# Patient Record
Sex: Male | Born: 1965 | Race: White | Hispanic: No | Marital: Married | Smoking: Never smoker
Health system: Southern US, Community
[De-identification: ages and names within clinical notes are randomized; demographics above are authoritative.]

## PROBLEM LIST (undated history)

## (undated) DIAGNOSIS — I10 Essential (primary) hypertension: Secondary | ICD-10-CM

## (undated) DIAGNOSIS — E785 Hyperlipidemia, unspecified: Secondary | ICD-10-CM

## (undated) DIAGNOSIS — G4730 Sleep apnea, unspecified: Secondary | ICD-10-CM

## (undated) HISTORY — DX: Essential (primary) hypertension: I10

## (undated) HISTORY — DX: Morbid (severe) obesity due to excess calories: E66.01

## (undated) HISTORY — DX: Sleep apnea, unspecified: G47.30

## (undated) HISTORY — DX: Hyperlipidemia, unspecified: E78.5

---

## 2014-06-30 ENCOUNTER — Emergency Department: Payer: Self-pay | Admitting: Emergency Medicine

## 2014-11-10 ENCOUNTER — Telehealth: Payer: Self-pay | Admitting: Family Medicine

## 2014-11-10 MED ORDER — ZOLPIDEM TARTRATE 10 MG PO TABS: 10.0000 mg | ORAL_TABLET | Freq: Once | ORAL | Status: DC

## 2014-11-10 NOTE — Telephone Encounter (Signed)
Patient uses [employer] in [place]

## 2014-11-10 NOTE — Telephone Encounter (Signed)
Rx called in to [facility]

## 2014-11-10 NOTE — Telephone Encounter (Signed)
Pt came in and said that he has his sleep study scheduled for Monday June 27 and they requested a sleep aid before he goes. He would like a call back at [phone]

## 2014-11-18 ENCOUNTER — Telehealth: Payer: Self-pay | Admitting: Family Medicine

## 2014-11-18 NOTE — Telephone Encounter (Signed)
Call pt resleep

## 2014-11-30 ENCOUNTER — Telehealth: Payer: Self-pay

## 2014-11-30 NOTE — Telephone Encounter (Signed)
Sleep study

## 2015-04-06 DIAGNOSIS — E785 Hyperlipidemia, unspecified: Secondary | ICD-10-CM | POA: Insufficient documentation

## 2015-04-06 DIAGNOSIS — I10 Essential (primary) hypertension: Secondary | ICD-10-CM | POA: Insufficient documentation

## 2015-04-07 ENCOUNTER — Ambulatory Visit (INDEPENDENT_AMBULATORY_CARE_PROVIDER_SITE_OTHER): Payer: BLUE CROSS/BLUE SHIELD | Admitting: Family Medicine

## 2015-04-07 ENCOUNTER — Encounter: Payer: Self-pay | Admitting: Family Medicine

## 2015-04-07 VITALS — BP 130/81 | HR 67 | Temp 97.9°F | Ht 71.6 in | Wt 297.0 lb

## 2015-04-07 DIAGNOSIS — G4730 Sleep apnea, unspecified: Secondary | ICD-10-CM | POA: Insufficient documentation

## 2015-04-07 DIAGNOSIS — E785 Hyperlipidemia, unspecified: Secondary | ICD-10-CM | POA: Diagnosis not present

## 2015-04-07 DIAGNOSIS — Z0000 Encounter for general adult medical examination without abnormal findings: Secondary | ICD-10-CM

## 2015-04-07 DIAGNOSIS — I10 Essential (primary) hypertension: Secondary | ICD-10-CM | POA: Diagnosis not present

## 2015-04-07 DIAGNOSIS — Z113 Encounter for screening for infections with a predominantly sexual mode of transmission: Secondary | ICD-10-CM

## 2015-04-07 LAB — URINALYSIS, ROUTINE W REFLEX MICROSCOPIC
BILIRUBIN UA: NEGATIVE
GLUCOSE, UA: NEGATIVE
Ketones, UA: NEGATIVE
Leukocytes, UA: NEGATIVE
Nitrite, UA: NEGATIVE
PROTEIN UA: NEGATIVE
RBC UA: NEGATIVE
SPEC GRAV UA: 1.015 (ref 1.005–1.030)
Urobilinogen, Ur: 0.2 mg/dL (ref 0.2–1.0)
pH, UA: 7 (ref 5.0–7.5)

## 2015-04-07 MED ORDER — AMLODIPINE BESYLATE 10 MG PO TABS: 10.0000 mg | ORAL_TABLET | Freq: Every day | ORAL | Status: DC

## 2015-04-07 MED ORDER — LOSARTAN POTASSIUM 100 MG PO TABS: 100.0000 mg | ORAL_TABLET | Freq: Every day | ORAL | Status: DC

## 2015-04-07 MED ORDER — ATORVASTATIN CALCIUM 40 MG PO TABS: 40.0000 mg | ORAL_TABLET | Freq: Every day | ORAL | Status: DC

## 2015-04-07 MED ORDER — RANITIDINE HCL 150 MG PO TABS: 150.0000 mg | ORAL_TABLET | Freq: Two times a day (BID) | ORAL | Status: DC

## 2015-04-07 NOTE — Assessment & Plan Note (Signed)
Discuussed diet

## 2015-04-07 NOTE — Assessment & Plan Note (Signed)
Using CPAP faithfully 's made a big difference no further daytime drowsiness

## 2015-04-07 NOTE — Assessment & Plan Note (Signed)
The current medical regimen is effective;  continue present plan and medications.  

## 2015-04-07 NOTE — Progress Notes (Signed)
BP 130/81 mmHg  Pulse 67  Temp(Src) 97.9 F (36.6 C)  Ht 5' 11.6" (1.819 m)  Wt 297 lb (134.718 kg)  BMI 40.72 kg/m2  SpO2 98%   Subjective:    Patient ID: Christopher PastorMark Edward Barclift, male    DOB: [birth year], 49 y.o.   MRN: [id number]  HPI: Christopher Hull is a 49 y.o. male  Chief Complaint  Patient presents with  . Annual Exam   patein's been using his CPAP faithfully made a world of difference to quote the patient.  blood pressures been doing  Well no complaints from medications  Cholesterol doing well no complaints  reflux doing well on Zantac no issues  Relevant past medical, surgical, family and social history reviewed and updated as indicated. Interim medical history since our last visit reviewed. Allergies and medications reviewed and updated.  Review of Systems  Constitutional: Negative.   HENT: Negative.   Eyes: Negative.   Respiratory: Negative.   Cardiovascular: Negative.   Gastrointestinal: Negative.   Endocrine: Negative.   Genitourinary: Negative.   Musculoskeletal: Negative.   Skin: Negative.   Allergic/Immunologic: Negative.   Neurological: Negative.   Hematological: Negative.   Psychiatric/Behavioral: Negative.     Per HPI unless specifically indicated above     Objective:    BP 130/81 mmHg  Pulse 67  Temp(Src) 97.9 F (36.6 C)  Ht 5' 11.6" (1.819 m)  Wt 297 lb (134.718 kg)  BMI 40.72 kg/m2  SpO2 98%  Wt Readings from Last 3 Encounters:  04/07/15 297 lb (134.718 kg)  09/22/14 304 lb (137.893 kg)    Physical Exam  Constitutional: He is oriented to person, place, and time. He appears well-developed and well-nourished.  HENT:  Head: Normocephalic and atraumatic.  Right Ear: External ear normal.  Left Ear: External ear normal.  Eyes: Conjunctivae and EOM are normal. Pupils are equal, round, and reactive to light.  Neck: Normal range of motion. Neck supple.  Cardiovascular: Normal rate, regular rhythm, normal heart sounds and intact distal  pulses.   Pulmonary/Chest: Effort normal and breath sounds normal.  Abdominal: Soft. Bowel sounds are normal. There is no splenomegaly or hepatomegaly.  Genitourinary: Rectum normal, prostate normal and penis normal.  Musculoskeletal: Normal range of motion.  Neurological: He is alert and oriented to person, place, and time. He has normal reflexes.  Skin: No rash noted. No erythema.  Psychiatric: He has a normal mood and affect. His behavior is normal. Judgment and thought content normal.    No results found for this or any previous visit.    Assessment & Plan:   Problem List Items Addressed This Visit      Cardiovascular and Mediastinum   Essential hypertension    The current medical regimen is effective;  continue present plan and medications.       Relevant Medications   amLODipine (NORVASC) 10 MG tablet   atorvastatin (LIPITOR) 40 MG tablet   losartan (COZAAR) 100 MG tablet     Other   Sleep apnea     Using CPAP faithfully 's made a big difference no further daytime drowsiness      Morbid obesity (HCC)     Discuussed diet      Hyperlipidemia    The current medical regimen is effective;  continue present plan and medications.       Relevant Medications   amLODipine (NORVASC) 10 MG tablet   atorvastatin (LIPITOR) 40 MG tablet   losartan (COZAAR) 100 MG tablet  Other Visit Diagnoses    Routine general medical examination at a health care facility    -  Primary    Relevant Orders    CBC with Differential/Platelet    Comprehensive metabolic panel    Lipid Panel w/o Chol/HDL Ratio    PSA    TSH    Urinalysis, Routine w reflex microscopic (not at [facility])    Routine screening for STI (sexually transmitted infection)        Relevant Orders    HIV antibody        Follow up plan: Return in about 6 months (around 10/05/2015) for BMP and med check , lipids, alt, ast.

## 2015-04-07 NOTE — Addendum Note (Signed)
Addended byVonita Moss: Billy Rocco, [place] on: 04/07/2015 05:15 PM   Modules accepted: K[employer] BroodSmartSet

## 2015-04-08 LAB — COMPREHENSIVE METABOLIC PANEL
A/G RATIO: 1.5 (ref 1.1–2.5)
ALK PHOS: 106 IU/L (ref 39–117)
ALT: 27 IU/L (ref 0–44)
AST: 18 IU/L (ref 0–40)
Albumin: 4.1 g/dL (ref 3.5–5.5)
BUN / CREAT RATIO: 17 (ref 9–20)
BUN: 12 mg/dL (ref 6–24)
Bilirubin Total: 0.6 mg/dL (ref 0.0–1.2)
CO2: 24 mmol/L (ref 18–29)
Calcium: 9.4 mg/dL (ref 8.7–10.2)
Chloride: 101 mmol/L (ref 97–106)
Creatinine, Ser: 0.69 mg/dL — ABNORMAL LOW (ref 0.76–1.27)
GFR calc non Af Amer: 111 mL/min/{1.73_m2} (ref >59)
GFR, EST AFRICAN AMERICAN: 129 mL/min/{1.73_m2} (ref >59)
Globulin, Total: 2.8 g/dL (ref 1.5–4.5)
Glucose: 102 mg/dL — ABNORMAL HIGH (ref 65–99)
POTASSIUM: 4.3 mmol/L (ref 3.5–5.2)
Sodium: 140 mmol/L (ref 136–144)
TOTAL PROTEIN: 6.9 g/dL (ref 6.0–8.5)

## 2015-04-08 LAB — CBC WITH DIFFERENTIAL/PLATELET
BASOS ABS: 0 10*3/uL (ref 0.0–0.2)
BASOS: 0 %
EOS (ABSOLUTE): 0.1 10*3/uL (ref 0.0–0.4)
Eos: 1 %
Hematocrit: 42.3 % (ref 37.5–51.0)
Hemoglobin: 14.8 g/dL (ref 12.6–17.7)
IMMATURE GRANS (ABS): 0 10*3/uL (ref 0.0–0.1)
Immature Granulocytes: 0 %
LYMPHS ABS: 1.9 10*3/uL (ref 0.7–3.1)
Lymphs: 31 %
MCH: 31.2 pg (ref 26.6–33.0)
MCHC: 35 g/dL (ref 31.5–35.7)
MCV: 89 fL (ref 79–97)
MONOS ABS: 0.6 10*3/uL (ref 0.1–0.9)
Monocytes: 9 %
NEUTROS ABS: 3.6 10*3/uL (ref 1.4–7.0)
Neutrophils: 59 %
Platelets: 324 10*3/uL (ref 150–379)
RBC: 4.75 x10E6/uL (ref 4.14–5.80)
RDW: 12.3 % (ref 12.3–15.4)
WBC: 6.3 10*3/uL (ref 3.4–10.8)

## 2015-04-08 LAB — LIPID PANEL W/O CHOL/HDL RATIO
CHOLESTEROL TOTAL: 124 mg/dL (ref 100–199)
HDL: 38 mg/dL — ABNORMAL LOW (ref >39)
LDL Calculated: 75 mg/dL (ref 0–99)
Triglycerides: 55 mg/dL (ref 0–149)
VLDL Cholesterol Cal: 11 mg/dL (ref 5–40)

## 2015-04-08 LAB — TSH: TSH: 1.99 u[IU]/mL (ref 0.450–4.500)

## 2015-04-08 LAB — PSA: Prostate Specific Ag, Serum: 1 ng/mL (ref 0.0–4.0)

## 2015-04-08 LAB — HIV ANTIBODY (ROUTINE TESTING W REFLEX): HIV Screen 4th Generation wRfx: NONREACTIVE

## 2015-04-11 ENCOUNTER — Encounter: Payer: Self-pay | Admitting: Family Medicine

## 2015-09-30 ENCOUNTER — Encounter: Payer: Self-pay | Admitting: Internal Medicine

## 2015-10-06 ENCOUNTER — Ambulatory Visit: Payer: BLUE CROSS/BLUE SHIELD | Admitting: Family Medicine

## 2017-04-07 ENCOUNTER — Encounter: Payer: Self-pay | Admitting: Family Medicine

## 2017-04-16 ENCOUNTER — Ambulatory Visit: Payer: Self-pay | Admitting: Family Medicine

## 2017-04-16 ENCOUNTER — Encounter: Payer: Self-pay | Admitting: Family Medicine

## 2017-04-16 VITALS — BP 138/80 | HR 80 | Ht 73.0 in | Wt 301.0 lb

## 2017-04-16 DIAGNOSIS — Z0289 Encounter for other administrative examinations: Secondary | ICD-10-CM

## 2017-04-16 DIAGNOSIS — G4730 Sleep apnea, unspecified: Secondary | ICD-10-CM

## 2017-04-16 DIAGNOSIS — I10 Essential (primary) hypertension: Secondary | ICD-10-CM

## 2017-04-16 DIAGNOSIS — Z029 Encounter for administrative examinations, unspecified: Secondary | ICD-10-CM

## 2017-04-16 NOTE — Assessment & Plan Note (Signed)
The current medical regimen is effective;  continue present plan and medications.  

## 2017-04-16 NOTE — Progress Notes (Signed)
BP 138/80   Pulse 80   Ht 6\' 1"  (1.854 m)   Wt (!) 301 lb (136.5 kg)   BMI 39.71 kg/m    Subjective:    Patient ID: Christopher Hull, male    DOB: 1966/02/12, 51 y.o.   MRN: [id number]  HPI: Christopher Hull is a 51 y.o. male  Chief Complaint  Patient presents with  . Flight    Class 3    Patientall in all doing well has sleep apnea report showing 99% usage for a half hours with no daytime drowsiness.  Blood pressure well controlled with amlodipine and losartan. Patient was given some pramipexole by sleep specialist to help with maybe some restless leg symptoms. Patient hasn't noticed any difference.Reviewed this medication with patient and he will not take this medicine. Relevant past medical, surgical, family and social history reviewed and updated as indicated. Interim medical history since our last visit reviewed. Allergies and medications reviewed and updated.  Review of Systems  Constitutional: Negative.   HENT: Negative.   Eyes: Negative.   Respiratory: Negative.   Cardiovascular: Negative.   Gastrointestinal: Negative.   Endocrine: Negative.   Genitourinary: Negative.   Musculoskeletal: Negative.   Skin: Negative.   Allergic/Immunologic: Negative.   Neurological: Negative.   Hematological: Negative.   Psychiatric/Behavioral: Negative.     Per HPI unless specifically indicated above     Objective:    BP 138/80   Pulse 80   Ht 6\' 1"  (1.854 m)   Wt (!) 301 lb (136.5 kg)   BMI 39.71 kg/m   Wt Readings from Last 3 Encounters:  04/16/17 (!) 301 lb (136.5 kg)  04/07/15 297 lb (134.7 kg)  09/22/14 (!) 304 lb (137.9 kg)    Physical Exam  Constitutional: He is oriented to person, place, and time. He appears well-developed and well-nourished.  HENT:  Head: Normocephalic.  Right Ear: External ear normal.  Left Ear: External ear normal.  Nose: Nose normal.  Eyes: Conjunctivae and EOM are normal. Pupils are equal, round, and reactive to light.  Neck:  Normal range of motion. Neck supple. No thyromegaly present.  Cardiovascular: Normal rate, regular rhythm, normal heart sounds and intact distal pulses.  Pulmonary/Chest: Effort normal and breath sounds normal.  Abdominal: Soft. Bowel sounds are normal. There is no splenomegaly or hepatomegaly.  Genitourinary: Penis normal.  Musculoskeletal: Normal range of motion.  Lymphadenopathy:    He has no cervical adenopathy.  Neurological: He is alert and oriented to person, place, and time. He has normal reflexes.  Skin: Skin is warm and dry.  Psychiatric: He has a normal mood and affect. His behavior is normal. Judgment and thought content normal.    Results for orders placed or performed in visit on 04/07/15  CBC with Differential/Platelet  Result Value Ref Range   WBC 6.3 3.4 - 10.8 x10E3/uL   RBC 4.75 4.14 - 5.80 x10E6/uL   Hemoglobin 14.8 12.6 - 17.7 g/dL   Hematocrit 46.942.3 62.937.5 - 51.0 %   MCV 89 79 - 97 fL   MCH 31.2 26.6 - 33.0 pg   MCHC 35.0 31.5 - 35.7 g/dL   RDW 52.812.3 41.312.3 - 24.415.4 %   Platelets 324 150 - 379 x10E3/uL   Neutrophils 59 %   Lymphs 31 %   Monocytes 9 %   Eos 1 %   Basos 0 %   Neutrophils Absolute 3.6 1.4 - 7.0 x10E3/uL   Lymphocytes Absolute 1.9 0.7 - 3.1 x10E3/uL  Monocytes Absolute 0.6 0.1 - 0.9 x10E3/uL   EOS (ABSOLUTE) 0.1 0.0 - 0.4 x10E3/uL   Basophils Absolute 0.0 0.0 - 0.2 x10E3/uL   Immature Granulocytes 0 %   Immature Grans (Abs) 0.0 0.0 - 0.1 x10E3/uL  Comprehensive metabolic panel  Result Value Ref Range   Glucose 102 (H) 65 - 99 mg/dL   BUN 12 6 - 24 mg/dL   Creatinine, Ser 1.610.69 (L) 0.76 - 1.27 mg/dL   GFR calc non Af Amer 111 >59 mL/min/1.73   GFR calc Af Amer 129 >59 mL/min/1.73   BUN/Creatinine Ratio 17 9 - 20   Sodium 140 136 - 144 mmol/L   Potassium 4.3 3.5 - 5.2 mmol/L   Chloride 101 97 - 106 mmol/L   CO2 24 18 - 29 mmol/L   Calcium 9.4 8.7 - 10.2 mg/dL   Total Protein 6.9 6.0 - 8.5 g/dL   Albumin 4.1 3.5 - 5.5 g/dL   Globulin, Total  2.8 1.5 - 4.5 g/dL   Albumin/Globulin Ratio 1.5 1.1 - 2.5   Bilirubin Total 0.6 0.0 - 1.2 mg/dL   Alkaline Phosphatase 106 39 - 117 IU/L   AST 18 0 - 40 IU/L   ALT 27 0 - 44 IU/L  Lipid Panel w/o Chol/HDL Ratio  Result Value Ref Range   Cholesterol, Total 124 100 - 199 mg/dL   Triglycerides 55 0 - 149 mg/dL   HDL 38 (L) >09>39 mg/dL   VLDL Cholesterol Cal 11 5 - 40 mg/dL   LDL Calculated 75 0 - 99 mg/dL  PSA  Result Value Ref Range   Prostate Specific Ag, Serum 1.0 0.0 - 4.0 ng/mL  TSH  Result Value Ref Range   TSH 1.990 0.450 - 4.500 uIU/mL  Urinalysis, Routine w reflex microscopic (not at [facility])  Result Value Ref Range   Specific Gravity, UA 1.015 1.005 - 1.030   pH, UA 7.0 5.0 - 7.5   Color, UA Yellow Yellow   Appearance Ur Clear Clear   Leukocytes, UA Negative Negative   Protein, UA Negative Negative/Trace   Glucose, UA Negative Negative   Ketones, UA Negative Negative   RBC, UA Negative Negative   Bilirubin, UA Negative Negative   Urobilinogen, Ur 0.2 0.2 - 1.0 mg/dL   Nitrite, UA Negative Negative  HIV antibody  Result Value Ref Range   HIV Screen 4th Generation wRfx Non Reactive Non Reactive      Assessment & Plan:   Problem List Items Addressed This Visit      Cardiovascular and Mediastinum   Essential hypertension    The current medical regimen is effective;  continue present plan and medications.         Respiratory   Sleep apnea    The current medical regimen is effective;  continue present plan and medications.        Other Visit Diagnoses    Encounter for Investment banker, [employer]) examination    -  Primary       Follow up plan: Return if symptoms worsen or fail to improve, for As scheduled.

## 2017-04-16 NOTE — Addendum Note (Signed)
Addended by: Vonita MossRISSMAN, Prestyn A on: 04/16/2017 02:51 PM   Modules accepted: Orders

## 2017-04-17 ENCOUNTER — Encounter: Payer: Self-pay | Admitting: Family Medicine

## 2017-04-17 LAB — BASIC METABOLIC PANEL
BUN/Creatinine Ratio: 19 (ref 9–20)
BUN: 15 mg/dL (ref 6–24)
CO2: 27 mmol/L (ref 20–29)
Calcium: 9.4 mg/dL (ref 8.7–10.2)
Chloride: 98 mmol/L (ref 96–106)
Creatinine, Ser: 0.78 mg/dL (ref 0.76–1.27)
GFR calc Af Amer: 120 mL/min/{1.73_m2} (ref >59)
GFR, EST NON AFRICAN AMERICAN: 104 mL/min/{1.73_m2} (ref >59)
GLUCOSE: 89 mg/dL (ref 65–99)
Potassium: 4.2 mmol/L (ref 3.5–5.2)
SODIUM: 141 mmol/L (ref 134–144)

## 2017-06-24 ENCOUNTER — Other Ambulatory Visit: Payer: Self-pay

## 2017-06-24 MED ORDER — AMLODIPINE BESYLATE 10 MG PO TABS: 10.0000 mg | ORAL_TABLET | Freq: Every day | ORAL | 6 refills | Status: DC

## 2017-06-24 MED ORDER — ATORVASTATIN CALCIUM 40 MG PO TABS: 40.0000 mg | ORAL_TABLET | Freq: Every day | ORAL | 6 refills | Status: DC

## 2017-06-24 MED ORDER — LOSARTAN POTASSIUM 100 MG PO TABS: 100.0000 mg | ORAL_TABLET | Freq: Every day | ORAL | 6 refills | Status: DC

## 2017-06-24 MED ORDER — RANITIDINE HCL 150 MG PO TABS: 150.0000 mg | ORAL_TABLET | Freq: Two times a day (BID) | ORAL | 6 refills | Status: DC

## 2017-06-24 NOTE — Telephone Encounter (Signed)
Apt May or so BP and med check

## 2017-07-11 ENCOUNTER — Encounter: Payer: Self-pay | Admitting: Family Medicine

## 2017-07-11 NOTE — Telephone Encounter (Signed)
Letter mailed

## 2017-12-31 ENCOUNTER — Other Ambulatory Visit: Payer: Self-pay

## 2018-01-01 MED ORDER — AMLODIPINE BESYLATE 10 MG PO TABS: 10.0000 mg | ORAL_TABLET | Freq: Every day | ORAL | 0 refills | Status: DC

## 2018-01-01 MED ORDER — ATORVASTATIN CALCIUM 40 MG PO TABS: 40.0000 mg | ORAL_TABLET | Freq: Every day | ORAL | 0 refills | Status: DC

## 2018-01-01 MED ORDER — LOSARTAN POTASSIUM 100 MG PO TABS: 100.0000 mg | ORAL_TABLET | Freq: Every day | ORAL | 0 refills | Status: DC

## 2018-01-01 MED ORDER — RANITIDINE HCL 150 MG PO TABS: 150.0000 mg | ORAL_TABLET | Freq: Two times a day (BID) | ORAL | 0 refills | Status: DC

## 2018-01-01 NOTE — Telephone Encounter (Signed)
#  15 sent -- needs appt for further refills

## 2018-02-21 ENCOUNTER — Other Ambulatory Visit: Payer: Self-pay | Admitting: Family Medicine

## 2018-02-21 NOTE — Telephone Encounter (Signed)
Requested medication (s) are due for refill today: yes  Requested medication (s) are on the active medication list: yes  Last refill:  02/05/18  Future visit scheduled: yes  Notes to clinic: last office visit 04/07/15    Requested Prescriptions  Pending Prescriptions Disp Refills   ranitidine (ZANTAC) 150 MG tablet [Pharmacy Med Name: RANITIDINE 150 MG TABLET] 30 tablet 0    Sig: TAKE 1 TABLET BY MOUTH TWICE DAILY     Gastroenterology:  H2 Antagonists Passed - 02/21/2018 11:51 AM      Passed - Valid encounter within last 12 months    Recent Outpatient Visits          10 months ago Audiological scientist for [employer] examination   [facility], Redge Gainer, MD   2 years ago Routine general medical examination at a health care facility   [facility], Redge Gainer, MD            losartan (COZAAR) 100 MG tablet [Pharmacy Med Name: LOSARTAN POTASSIUM 100 MG TAB] 15 tablet 0    Sig: TAKE (1) TABLET BY MOUTH DAILY FOR HIGH BLOOD PRESSURE.     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   [facility] [facility], Redge Gainer, MD   2 years ago Routine general medical examination at a health care facility   [facility], Redge Gainer, MD             Passed - Patient is not pregnant      Passed - Last BP in normal range    BP Readings from Last 1 Encounters:  04/16/17 138/80        atorvastatin (LIPITOR) 40 MG tablet [Pharmacy  Med Name: ATORVASTATIN 40 MG TABLET] 15 tablet 0    Sig: TAKE 1 TABLET BY MOUTH AT BEDTIME FOR CHOLESTEROL     Cardiovascular:  Antilipid - Statins Failed - 02/21/2018 11:51 AM      Failed - Total Cholesterol in normal range and within 360 days    Cholesterol, Total  Date Value Ref Range Status  04/07/2015 124 100 - 199 mg/dL Final         Failed - LDL in normal range and within 360 days    LDL Calculated  Date Value Ref Range Status  04/07/2015 75 0 - 99 mg/dL Final         Failed - HDL in normal range and within 360 days    HDL  Date Value Ref Range Status  04/07/2015 38 (L) >39 mg/dL Final         Failed - Triglycerides in normal range and within 360 days    Triglycerides  Date Value Ref Range Status  04/07/2015 55 0 - 149 mg/dL Final  Passed - Patient is not pregnant      Passed - Valid encounter within last 12 months    Recent Outpatient Visits          10 months ago Audiological scientist for [employer] examination   [facility], Redge Gainer, MD   2 years ago Routine general medical examination at a health care facility   [facility], Redge Gainer, MD            amLODipine (NORVASC) 10 MG tablet [Pharmacy Med Name: AMLODIPINE BESYLATE 10 MG TAB] 15 tablet 0    Sig: TAKE (1) TABLET BY MOUTH DAILY FOR HIGH BLOOD PRESSURE.     Cardiovascular:  Calcium Channel Blockers Failed - 02/21/2018 11:51 AM      Failed - Valid encounter within last 6 months    Recent Outpatient Visits          10 months ago Encounter for [employer] examination   [facility], Redge Gainer, MD   2 years ago Routine general medical examination at a health care facility   [facility], Redge Gainer, MD             Passed - Last BP in normal range    BP Readings from Last 1 Encounters:  04/16/17 138/80

## 2018-02-24 ENCOUNTER — Telehealth: Payer: Self-pay | Admitting: Family Medicine

## 2018-02-24 MED ORDER — ATORVASTATIN CALCIUM 40 MG PO TABS: 40.0000 mg | ORAL_TABLET | Freq: Every day | ORAL | 0 refills | Status: DC

## 2018-02-24 MED ORDER — LOSARTAN POTASSIUM 100 MG PO TABS: 100.0000 mg | ORAL_TABLET | Freq: Every day | ORAL | 0 refills | Status: DC

## 2018-02-24 MED ORDER — RANITIDINE HCL 150 MG PO TABS: 150.0000 mg | ORAL_TABLET | Freq: Two times a day (BID) | ORAL | 0 refills | Status: DC

## 2018-02-24 MED ORDER — AMLODIPINE BESYLATE 10 MG PO TABS: 10.0000 mg | ORAL_TABLET | Freq: Every day | ORAL | 0 refills | Status: DC

## 2018-02-24 NOTE — Telephone Encounter (Signed)
Pt has an appointment on 10/152019 but has ran out of medicine. Wants to know if he can get a refill until the next appointment. Pt would like to have atorvastatin 40 MG, amlodipine 10 mg,rantidine 150 Mg, and losartan 100 mg. States he has reached out to pharmacy. Wants sent to [facility] in [place].

## 2018-02-24 NOTE — Telephone Encounter (Signed)
Already sent  Copied from CRM [id number]. Topic: General - Other >> Feb 24, 2018 10:40 AM Percival Spanish wrote: Pt is out of the med and has an appt on 03/04/18 and is asking if enough meds can be called in till his appt     amLODipine (NORVASC) 10 MG tablet   atorvastatin (LIPITOR) 40 MG tablet   losartan (COZAAR) 100 MG tablet   ranitidine (ZANTAC) 150 MG tablet   Pharmacy [facility]

## 2018-02-24 NOTE — Telephone Encounter (Signed)
RXs sent.

## 2018-03-04 ENCOUNTER — Ambulatory Visit (INDEPENDENT_AMBULATORY_CARE_PROVIDER_SITE_OTHER): Payer: Self-pay | Admitting: Family Medicine

## 2018-03-04 ENCOUNTER — Encounter: Payer: Self-pay | Admitting: Family Medicine

## 2018-03-04 DIAGNOSIS — G4730 Sleep apnea, unspecified: Secondary | ICD-10-CM

## 2018-03-04 DIAGNOSIS — I10 Essential (primary) hypertension: Secondary | ICD-10-CM

## 2018-03-04 DIAGNOSIS — E785 Hyperlipidemia, unspecified: Secondary | ICD-10-CM

## 2018-03-04 MED ORDER — RANITIDINE HCL 150 MG PO TABS: 150.0000 mg | ORAL_TABLET | Freq: Two times a day (BID) | ORAL | 1 refills | Status: DC

## 2018-03-04 MED ORDER — ATORVASTATIN CALCIUM 40 MG PO TABS: 40.0000 mg | ORAL_TABLET | Freq: Every day | ORAL | 1 refills | Status: DC

## 2018-03-04 MED ORDER — AMLODIPINE BESYLATE 10 MG PO TABS: 10.0000 mg | ORAL_TABLET | Freq: Every day | ORAL | 1 refills | Status: DC

## 2018-03-04 MED ORDER — LOSARTAN POTASSIUM 100 MG PO TABS: 100.0000 mg | ORAL_TABLET | Freq: Every day | ORAL | 1 refills | Status: DC

## 2018-03-04 NOTE — Assessment & Plan Note (Signed)
Using CPAP faithfully with resolution of daytime drowsiness

## 2018-03-04 NOTE — Assessment & Plan Note (Signed)
The current medical regimen is effective;  continue present plan and medications.  

## 2018-03-04 NOTE — Assessment & Plan Note (Signed)
Discussed weight loss diet nutrition 

## 2018-03-04 NOTE — Progress Notes (Signed)
BP (!) 150/96 (BP Location: Left Arm)   Pulse 64   Temp (!) 97.1 F (36.2 C) (Oral)   Ht 6\' 1"  (1.854 m)   Wt (!) 313 lb 3.2 oz (142.1 kg)   SpO2 95%   BMI 41.32 kg/m    Subjective:    Patient ID: Christopher Hull, male    DOB: October 20, 1965, 52 y.o.   MRN: [id number]  HPI: Christopher Hull is a 52 y.o. male  Chief Complaint  Patient presents with  . Hyperlipidemia  . Hypertension   Patient all in all doing okay taking amlodipine losartan every day without problems.  Also taking Lipitor 40 mg a day without problems and wondered about taking Zantac on a regular basis. Reflux is stable on his current regimen of twice a day. Patient unfortunately has gained weight over this last summer.  Has noted elevated blood pressure readings especially with whitecoat. Relevant past medical, surgical, family and social history reviewed and updated as indicated. Interim medical history since our last visit reviewed. Allergies and medications reviewed and updated.  Review of Systems  Constitutional: Negative.   Respiratory: Negative.   Cardiovascular: Negative.     Per HPI unless specifically indicated above     Objective:    BP (!) 150/96 (BP Location: Left Arm)   Pulse 64   Temp (!) 97.1 F (36.2 C) (Oral)   Ht 6\' 1"  (1.854 m)   Wt (!) 313 lb 3.2 oz (142.1 kg)   SpO2 95%   BMI 41.32 kg/m   Wt Readings from Last 3 Encounters:  03/04/18 (!) 313 lb 3.2 oz (142.1 kg)  04/16/17 (!) 301 lb (136.5 kg)  04/07/15 297 lb (134.7 kg)    Physical Exam  Constitutional: He is oriented to person, place, and time. He appears well-developed and well-nourished.  HENT:  Head: Normocephalic and atraumatic.  Eyes: Conjunctivae and EOM are normal.  Neck: Normal range of motion.  Cardiovascular: Normal rate, regular rhythm and normal heart sounds.  Pulmonary/Chest: Effort normal and breath sounds normal.  Musculoskeletal: Normal range of motion.  Neurological: He is alert and oriented to person,  place, and time.  Skin: No erythema.  Psychiatric: He has a normal mood and affect. His behavior is normal. Judgment and thought content normal.    Results for orders placed or performed in visit on 04/16/17  Basic metabolic panel  Result Value Ref Range   Glucose 89 65 - 99 mg/dL   BUN 15 6 - 24 mg/dL   Creatinine, Ser 4.09 0.76 - 1.27 mg/dL   GFR calc non Af Amer 104 >59 mL/min/1.73   GFR calc Af Amer 120 >59 mL/min/1.73   BUN/Creatinine Ratio 19 9 - 20   Sodium 141 134 - 144 mmol/L   Potassium 4.2 3.5 - 5.2 mmol/L   Chloride 98 96 - 106 mmol/L   CO2 27 20 - 29 mmol/L   Calcium 9.4 8.7 - 10.2 mg/dL      Assessment & Plan:   Problem List Items Addressed This Visit      Cardiovascular and Mediastinum   Essential hypertension    Discussed elevated blood pressure with need for better control patient will control with lifestyle weight loss and better nutrition.  Will recheck later this winter.      Relevant Medications   amLODipine (NORVASC) 10 MG tablet   losartan (COZAAR) 100 MG tablet   atorvastatin (LIPITOR) 40 MG tablet     Respiratory   Sleep apnea  Using CPAP faithfully with resolution of daytime drowsiness        Other   Hyperlipidemia    The current medical regimen is effective;  continue present plan and medications.       Relevant Medications   amLODipine (NORVASC) 10 MG tablet   losartan (COZAAR) 100 MG tablet   atorvastatin (LIPITOR) 40 MG tablet   Morbid obesity (HCC)    Discussed weight loss diet nutrition          Follow up plan: Return in about 3 months (around 06/04/2018) for Physical Exam.

## 2018-03-04 NOTE — Assessment & Plan Note (Signed)
Discussed elevated blood pressure with need for better control patient will control with lifestyle weight loss and better nutrition.  Will recheck later this winter.

## 2018-03-13 ENCOUNTER — Ambulatory Visit: Payer: BLUE CROSS/BLUE SHIELD | Admitting: Family Medicine

## 2018-06-04 ENCOUNTER — Encounter: Payer: Self-pay | Admitting: Family Medicine

## 2018-06-04 ENCOUNTER — Ambulatory Visit (INDEPENDENT_AMBULATORY_CARE_PROVIDER_SITE_OTHER): Payer: Self-pay | Admitting: Family Medicine

## 2018-06-04 VITALS — BP 140/83 | HR 62 | Temp 97.7°F | Ht 73.0 in | Wt 311.6 lb

## 2018-06-04 DIAGNOSIS — Z0000 Encounter for general adult medical examination without abnormal findings: Secondary | ICD-10-CM

## 2018-06-04 DIAGNOSIS — I10 Essential (primary) hypertension: Secondary | ICD-10-CM

## 2018-06-04 DIAGNOSIS — E785 Hyperlipidemia, unspecified: Secondary | ICD-10-CM

## 2018-06-04 LAB — URINALYSIS, ROUTINE W REFLEX MICROSCOPIC
Bilirubin, UA: NEGATIVE
Glucose, UA: NEGATIVE
Ketones, UA: NEGATIVE
LEUKOCYTES UA: NEGATIVE
Nitrite, UA: NEGATIVE
Protein, UA: NEGATIVE
RBC, UA: NEGATIVE
Specific Gravity, UA: 1.02 (ref 1.005–1.030)
Urobilinogen, Ur: 0.2 mg/dL (ref 0.2–1.0)
pH, UA: 6 (ref 5.0–7.5)

## 2018-06-04 MED ORDER — AMLODIPINE BESYLATE 10 MG PO TABS: 10.0000 mg | ORAL_TABLET | Freq: Every day | ORAL | 4 refills | Status: DC

## 2018-06-04 MED ORDER — ATORVASTATIN CALCIUM 40 MG PO TABS: 40.0000 mg | ORAL_TABLET | Freq: Every day | ORAL | 4 refills | Status: DC

## 2018-06-04 MED ORDER — RANITIDINE HCL 150 MG PO TABS: 150.0000 mg | ORAL_TABLET | Freq: Two times a day (BID) | ORAL | 4 refills | Status: DC

## 2018-06-04 MED ORDER — LOSARTAN POTASSIUM 100 MG PO TABS: 100.0000 mg | ORAL_TABLET | Freq: Every day | ORAL | 4 refills | Status: DC

## 2018-06-04 NOTE — Assessment & Plan Note (Signed)
The current medical regimen is effective;  continue present plan and medications.  

## 2018-06-04 NOTE — Progress Notes (Signed)
[facility] 06/04/2018 03/04/2018  Decreased Interest 0 0  Down, Depressed, Hopeless 0 0  PHQ - 2 Score 0 0  Altered sleeping 0 -  Tired, decreased energy 0 -  Change in appetite 0 -  Feeling bad or failure about yourself  0 -  Trouble concentrating 0 -  Moving slowly or fidgety/restless 0 -  Suicidal thoughts 0 -  PHQ-9 Score 0 -   Functional Status Survey: Is the patient deaf or have difficulty hearing?: No Does the patient have difficulty seeing, even when wearing glasses/contacts?: No Does the patient have difficulty concentrating, remembering, or making decisions?: No Does the patient have difficulty walking or climbing stairs?: No Does the patient have difficulty dressing or bathing?: No Does the patient have difficulty doing errands alone such as visiting a doctor's office or shopping?: No

## 2018-06-04 NOTE — Progress Notes (Signed)
BP 140/83 (BP Location: Left Arm, Patient Position: Sitting, Cuff Size: Large)   Pulse 62   Temp 97.7 F (36.5 C) (Oral)   Ht 6\' 1"  (1.854 m)   Wt (!) 311 lb 9.6 oz (141.3 kg)   SpO2 97%   BMI 41.11 kg/m    Subjective:    Patient ID: Christopher Hull, male    DOB: 03/22/1966, 53 y.o.   MRN: [id number]  HPI: Demetries Arnau is a 53 y.o. male  Chief Complaint  Patient presents with  . Annual Exam  Patient all in all doing okay except for weight.  Patient struggles with weight especially over the Christmas holidays. Blood pressures been doing okay along with cholesterol medication.  Takes ranitidine almost twice a day most days.  Relevant past medical, surgical, family and social history reviewed and updated as indicated. Interim medical history since our last visit reviewed. Allergies and medications reviewed and updated.  Review of Systems  Constitutional: Negative.   HENT: Negative.   Eyes: Negative.   Respiratory: Negative.   Cardiovascular: Negative.   Gastrointestinal: Negative.   Endocrine: Negative.   Genitourinary: Negative.   Musculoskeletal: Negative.   Skin: Negative.   Allergic/Immunologic: Negative.   Neurological: Negative.   Hematological: Negative.   Psychiatric/Behavioral: Negative.     Per HPI unless specifically indicated above     Objective:    BP 140/83 (BP Location: Left Arm, Patient Position: Sitting, Cuff Size: Large)   Pulse 62   Temp 97.7 F (36.5 C) (Oral)   Ht 6\' 1"  (1.854 m)   Wt (!) 311 lb 9.6 oz (141.3 kg)   SpO2 97%   BMI 41.11 kg/m   Wt Readings from Last 3 Encounters:  06/04/18 (!) 311 lb 9.6 oz (141.3 kg)  03/04/18 (!) 313 lb 3.2 oz (142.1 kg)  04/16/17 (!) 301 lb (136.5 kg)    Physical Exam Constitutional:      Appearance: He is well-developed.  HENT:     Head: Normocephalic and atraumatic.     Right Ear: External ear normal.     Left Ear: External ear normal.  Eyes:     Conjunctiva/sclera: Conjunctivae normal.       Pupils: Pupils are equal, round, and reactive to light.  Neck:     Musculoskeletal: Normal range of motion and neck supple.  Cardiovascular:     Rate and Rhythm: Normal rate and regular rhythm.     Heart sounds: Normal heart sounds.  Pulmonary:     Effort: Pulmonary effort is normal.     Breath sounds: Normal breath sounds.  Abdominal:     General: Bowel sounds are normal.     Palpations: Abdomen is soft. There is no hepatomegaly or splenomegaly.  Genitourinary:    Penis: Normal.      Prostate: Normal.     Rectum: Normal.  Musculoskeletal: Normal range of motion.  Skin:    Findings: No erythema or rash.  Neurological:     Mental Status: He is alert and oriented to person, place, and time.     Deep Tendon Reflexes: Reflexes are normal and symmetric.  Psychiatric:        Behavior: Behavior normal.        Thought Content: Thought content normal.        Judgment: Judgment normal.     Results for orders placed or performed in visit on 04/16/17  Basic metabolic panel  Result Value Ref Range   Glucose 89 65 -  99 mg/dL   BUN 15 6 - 24 mg/dL   Creatinine, Ser 1.54 0.76 - 1.27 mg/dL   GFR calc non Af Amer 104 >59 mL/min/1.73   GFR calc Af Amer 120 >59 mL/min/1.73   BUN/Creatinine Ratio 19 9 - 20   Sodium 141 134 - 144 mmol/L   Potassium 4.2 3.5 - 5.2 mmol/L   Chloride 98 96 - 106 mmol/L   CO2 27 20 - 29 mmol/L   Calcium 9.4 8.7 - 10.2 mg/dL      Assessment & Plan:   Problem List Items Addressed This Visit      Cardiovascular and Mediastinum   Essential hypertension    The current medical regimen is effective;  continue present plan and medications.       Relevant Medications   amLODipine (NORVASC) 10 MG tablet   losartan (COZAAR) 100 MG tablet   atorvastatin (LIPITOR) 40 MG tablet     Other   Hyperlipidemia    The current medical regimen is effective;  continue present plan and medications.       Relevant Medications   amLODipine (NORVASC) 10 MG tablet    losartan (COZAAR) 100 MG tablet   atorvastatin (LIPITOR) 40 MG tablet   Morbid obesity (HCC)    Discussed diet wt loss       Other Visit Diagnoses    PE (physical exam), annual    -  Primary   Relevant Orders   Comprehensive metabolic panel   Lipid panel   CBC with Differential/Platelet   TSH   Urinalysis, Routine w reflex microscopic   PSA       Follow up plan: Return in about 6 months (around 12/03/2018) for BMP,  Lipids, ALT, AST.

## 2018-06-04 NOTE — Assessment & Plan Note (Signed)
Discussed diet wt loss

## 2018-06-05 LAB — LIPID PANEL
Chol/HDL Ratio: 3.5 ratio (ref 0.0–5.0)
Cholesterol, Total: 137 mg/dL (ref 100–199)
HDL: 39 mg/dL — ABNORMAL LOW (ref >39)
LDL Calculated: 83 mg/dL (ref 0–99)
Triglycerides: 76 mg/dL (ref 0–149)
VLDL Cholesterol Cal: 15 mg/dL (ref 5–40)

## 2018-06-05 LAB — CBC WITH DIFFERENTIAL/PLATELET
Basophils Absolute: 0.1 10*3/uL (ref 0.0–0.2)
Basos: 1 %
EOS (ABSOLUTE): 0.1 10*3/uL (ref 0.0–0.4)
Eos: 2 %
Hematocrit: 42.7 % (ref 37.5–51.0)
Hemoglobin: 14.8 g/dL (ref 13.0–17.7)
IMMATURE GRANULOCYTES: 1 %
Immature Grans (Abs): 0 10*3/uL (ref 0.0–0.1)
LYMPHS ABS: 2.4 10*3/uL (ref 0.7–3.1)
Lymphs: 37 %
MCH: 32 pg (ref 26.6–33.0)
MCHC: 34.7 g/dL (ref 31.5–35.7)
MCV: 92 fL (ref 79–97)
Monocytes Absolute: 0.5 10*3/uL (ref 0.1–0.9)
Monocytes: 7 %
Neutrophils Absolute: 3.5 10*3/uL (ref 1.4–7.0)
Neutrophils: 52 %
Platelets: 273 10*3/uL (ref 150–450)
RBC: 4.63 x10E6/uL (ref 4.14–5.80)
RDW: 12.5 % (ref 11.6–15.4)
WBC: 6.6 10*3/uL (ref 3.4–10.8)

## 2018-06-05 LAB — COMPREHENSIVE METABOLIC PANEL
A/G RATIO: 1.5 (ref 1.2–2.2)
ALT: 34 IU/L (ref 0–44)
AST: 19 IU/L (ref 0–40)
Albumin: 4.3 g/dL (ref 3.5–5.5)
Alkaline Phosphatase: 142 IU/L — ABNORMAL HIGH (ref 39–117)
BILIRUBIN TOTAL: 0.7 mg/dL (ref 0.0–1.2)
BUN/Creatinine Ratio: 21 — ABNORMAL HIGH (ref 9–20)
BUN: 15 mg/dL (ref 6–24)
CHLORIDE: 105 mmol/L (ref 96–106)
CO2: 21 mmol/L (ref 20–29)
Calcium: 9.3 mg/dL (ref 8.7–10.2)
Creatinine, Ser: 0.73 mg/dL — ABNORMAL LOW (ref 0.76–1.27)
GFR calc Af Amer: 122 mL/min/{1.73_m2} (ref >59)
GFR calc non Af Amer: 106 mL/min/{1.73_m2} (ref >59)
Globulin, Total: 2.9 g/dL (ref 1.5–4.5)
Glucose: 87 mg/dL (ref 65–99)
Potassium: 4.1 mmol/L (ref 3.5–5.2)
Sodium: 142 mmol/L (ref 134–144)
Total Protein: 7.2 g/dL (ref 6.0–8.5)

## 2018-06-05 LAB — PSA: Prostate Specific Ag, Serum: 1 ng/mL (ref 0.0–4.0)

## 2018-06-05 LAB — TSH: TSH: 2.58 u[IU]/mL (ref 0.450–4.500)

## 2018-06-09 ENCOUNTER — Telehealth: Payer: Self-pay | Admitting: Family Medicine

## 2018-06-09 DIAGNOSIS — R748 Abnormal levels of other serum enzymes: Secondary | ICD-10-CM

## 2018-06-09 NOTE — Telephone Encounter (Signed)
-----   Message from Richarda Overlie, [place] sent at 06/09/2018  1:52 PM EST ----- Patient was transferred to provider for telephone conversation.

## 2018-06-09 NOTE — Telephone Encounter (Signed)
Phone call Discussed with patient elevated alkaline phosphatase will wait a couple of months and recheck blood work with CMP and if still elevated will further evaluate.

## 2018-12-03 ENCOUNTER — Other Ambulatory Visit: Payer: Self-pay

## 2018-12-03 ENCOUNTER — Encounter: Payer: Self-pay | Admitting: Family Medicine

## 2018-12-03 ENCOUNTER — Ambulatory Visit (INDEPENDENT_AMBULATORY_CARE_PROVIDER_SITE_OTHER): Payer: Self-pay | Admitting: Family Medicine

## 2018-12-03 DIAGNOSIS — I10 Essential (primary) hypertension: Secondary | ICD-10-CM

## 2018-12-03 DIAGNOSIS — E785 Hyperlipidemia, unspecified: Secondary | ICD-10-CM

## 2018-12-03 MED ORDER — OMEPRAZOLE 20 MG PO CPDR: 20.0000 mg | DELAYED_RELEASE_CAPSULE | Freq: Every day | ORAL | 3 refills | Status: DC

## 2018-12-03 NOTE — Assessment & Plan Note (Signed)
Wt gain with covid and leg edema wears support hose and does ok Discussed wt loss

## 2018-12-03 NOTE — Assessment & Plan Note (Signed)
The current medical regimen is effective;  continue present plan and medications.  

## 2018-12-03 NOTE — Progress Notes (Signed)
There were no vitals taken for this visit.   Subjective:    Patient ID: Christopher Hull, male    DOB: [birth year], 53 y.o.   MRN: [id number]  HPI: Christopher Hull is a 53 y.o. male  Med check Discussed medication with patient doing well with blood pressure good control no issues stopped ranitidine and switch to over-the-counter Prilosec which is done better and is been able to eat a regular diet without problems. 1 of the issues though is weight gain with increasing edema of his legs with no PND orthopnea. Wears compression socks and does okay also does some elevation.  Relevant past medical, surgical, family and social history reviewed and updated as indicated. Interim medical history since our last visit reviewed. Allergies and medications reviewed and updated.  Review of Systems  Constitutional: Negative.   Respiratory: Negative.   Cardiovascular: Negative.     Per HPI unless specifically indicated above     Objective:    There were no vitals taken for this visit.  Wt Readings from Last 3 Encounters:  06/04/18 (!) 311 lb 9.6 oz (141.3 kg)  03/04/18 (!) 313 lb 3.2 oz (142.1 kg)  04/16/17 (!) 301 lb (136.5 kg)    Physical Exam  Results for orders placed or performed in visit on 06/04/18  Comprehensive metabolic panel  Result Value Ref Range   Glucose 87 65 - 99 mg/dL   BUN 15 6 - 24 mg/dL   Creatinine, Ser 0.73 (L) 0.76 - 1.27 mg/dL   GFR calc non Af Amer 106 >59 mL/min/1.73   GFR calc Af Amer 122 >59 mL/min/1.73   BUN/Creatinine Ratio 21 (H) 9 - 20   Sodium 142 134 - 144 mmol/L   Potassium 4.1 3.5 - 5.2 mmol/L   Chloride 105 96 - 106 mmol/L   CO2 21 20 - 29 mmol/L   Calcium 9.3 8.7 - 10.2 mg/dL   Total Protein 7.2 6.0 - 8.5 g/dL   Albumin 4.3 3.5 - 5.5 g/dL   Globulin, Total 2.9 1.5 - 4.5 g/dL   Albumin/Globulin Ratio 1.5 1.2 - 2.2   Bilirubin Total 0.7 0.0 - 1.2 mg/dL   Alkaline Phosphatase 142 (H) 39 - 117 IU/L   AST 19 0 - 40 IU/L   ALT 34 0 - 44 IU/L   Lipid panel  Result Value Ref Range   Cholesterol, Total 137 100 - 199 mg/dL   Triglycerides 76 0 - 149 mg/dL   HDL 39 (L) >39 mg/dL   VLDL Cholesterol Cal 15 5 - 40 mg/dL   LDL Calculated 83 0 - 99 mg/dL   Chol/HDL Ratio 3.5 0.0 - 5.0 ratio  CBC with Differential/Platelet  Result Value Ref Range   WBC 6.6 3.4 - 10.8 x10E3/uL   RBC 4.63 4.14 - 5.80 x10E6/uL   Hemoglobin 14.8 13.0 - 17.7 g/dL   Hematocrit 42.7 37.5 - 51.0 %   MCV 92 79 - 97 fL   MCH 32.0 26.6 - 33.0 pg   MCHC 34.7 31.5 - 35.7 g/dL   RDW 12.5 11.6 - 15.4 %   Platelets 273 150 - 450 x10E3/uL   Neutrophils 52 Not Estab. %   Lymphs 37 Not Estab. %   Monocytes 7 Not Estab. %   Eos 2 Not Estab. %   Basos 1 Not Estab. %   Neutrophils Absolute 3.5 1.4 - 7.0 x10E3/uL   Lymphocytes Absolute 2.4 0.7 - 3.1 x10E3/uL   Monocytes Absolute 0.5 0.1 - 0.9  x10E3/uL   EOS (ABSOLUTE) 0.1 0.0 - 0.4 x10E3/uL   Basophils Absolute 0.1 0.0 - 0.2 x10E3/uL   Immature Granulocytes 1 Not Estab. %   Immature Grans (Abs) 0.0 0.0 - 0.1 x10E3/uL  TSH  Result Value Ref Range   TSH 2.580 0.450 - 4.500 uIU/mL  Urinalysis, Routine w reflex microscopic  Result Value Ref Range   Specific Gravity, UA 1.020 1.005 - 1.030   pH, UA 6.0 5.0 - 7.5   Color, UA Yellow Yellow   Appearance Ur Clear Clear   Leukocytes, UA Negative Negative   Protein, UA Negative Negative/Trace   Glucose, UA Negative Negative   Ketones, UA Negative Negative   RBC, UA Negative Negative   Bilirubin, UA Negative Negative   Urobilinogen, Ur 0.2 0.2 - 1.0 mg/dL   Nitrite, UA Negative Negative  PSA  Result Value Ref Range   Prostate Specific Ag, Serum 1.0 0.0 - 4.0 ng/mL      Assessment & Plan:   Problem List Items Addressed This Visit      Cardiovascular and Mediastinum   Essential hypertension    The current medical regimen is effective;  continue present plan and medications.       Relevant Orders   Basic metabolic panel     Other   Hyperlipidemia     The current medical regimen is effective;  continue present plan and medications.       Relevant Orders   LP+ALT+AST Piccolo, Waived   Morbid obesity (HCC)    Wt gain with covid and leg edema wears support hose and does ok Discussed wt loss         Telemedicine using audio/video telecommunications for a synchronous communication visit. Today's visit due to COVID-19 isolation precautions I connected with and verified that I am speaking with the correct person using two identifiers.   I discussed the limitations, risks, security and privacy concerns of performing an evaluation and management service by telecommunication and the availability of in person appointments. I also discussed with the patient that there may be a patient responsible charge related to this service. The patient expressed understanding and agreed to proceed. The patient's location is home. I am at home.   I discussed the assessment and treatment plan with the patient. The patient was provided an opportunity to ask questions and all were answered. The patient agreed with the plan and demonstrated an understanding of the instructions.   The patient was advised to call back or seek an in-person evaluation if the symptoms worsen or if the condition fails to improve as anticipated.   I provided 21+ minutes of time during this encounter. Follow up plan: Return in about 6 months (around 06/05/2019) for Physical Exam, .

## 2019-06-09 ENCOUNTER — Encounter: Payer: Self-pay | Admitting: Family Medicine

## 2019-12-10 ENCOUNTER — Ambulatory Visit: Payer: Self-pay | Admitting: Nurse Practitioner

## 2019-12-10 ENCOUNTER — Other Ambulatory Visit: Payer: Self-pay

## 2019-12-10 ENCOUNTER — Encounter: Payer: Self-pay | Admitting: Nurse Practitioner

## 2019-12-10 DIAGNOSIS — E782 Mixed hyperlipidemia: Secondary | ICD-10-CM

## 2019-12-10 DIAGNOSIS — I10 Essential (primary) hypertension: Secondary | ICD-10-CM

## 2019-12-10 DIAGNOSIS — G4733 Obstructive sleep apnea (adult) (pediatric): Secondary | ICD-10-CM

## 2019-12-10 DIAGNOSIS — R12 Heartburn: Secondary | ICD-10-CM | POA: Insufficient documentation

## 2019-12-10 MED ORDER — LOSARTAN POTASSIUM 100 MG PO TABS: 100.0000 mg | ORAL_TABLET | Freq: Every day | ORAL | 4 refills | Status: DC

## 2019-12-10 MED ORDER — OMEPRAZOLE 20 MG PO CPDR: 20.0000 mg | DELAYED_RELEASE_CAPSULE | Freq: Every day | ORAL | 4 refills | Status: DC

## 2019-12-10 MED ORDER — ATORVASTATIN CALCIUM 40 MG PO TABS: 40.0000 mg | ORAL_TABLET | Freq: Every day | ORAL | 4 refills | Status: DC

## 2019-12-10 MED ORDER — AMLODIPINE BESYLATE 10 MG PO TABS: 10.0000 mg | ORAL_TABLET | Freq: Every day | ORAL | 4 refills | Status: DC

## 2019-12-10 NOTE — Assessment & Plan Note (Signed)
Chronic, stable with BP at goal today.  Continue current medication regimen and adjust as needed.  Refills sent in.  BMP today.  Recommend he monitor BP at home a few days a week and document for provider, plus bring to visits, + focus on DASH diet.  Return in 6 months.

## 2019-12-10 NOTE — Assessment & Plan Note (Signed)
BMI 39.79 with HTN.  Recommended eating smaller high protein, low fat meals more frequently and exercising 30 mins a day 5 times a week with a goal of 10-15lb weight loss in the next 3 months. Patient voiced their understanding and motivation to adhere to these recommendations.

## 2019-12-10 NOTE — Assessment & Plan Note (Addendum)
Chronic, stable with Prilosec on board.  Continue current medication regimen and consider reduction in future, if poor tolerance then restart.  Mag level annually.  Refills sent.

## 2019-12-10 NOTE — Assessment & Plan Note (Signed)
Continue consistent use of CPAP daily.

## 2019-12-10 NOTE — Patient Instructions (Signed)
Preventing High Cholesterol Cholesterol is a white, waxy substance similar to fat that the human body needs to help build cells. The liver makes all the cholesterol that a person's body needs. Having high cholesterol (hypercholesterolemia) increases a person's risk for heart disease and stroke. Extra (excess) cholesterol comes from the food the person eats. High cholesterol can often be prevented with diet and lifestyle changes. If you already have high cholesterol, you can control it with diet and lifestyle changes and with medicine. How can high cholesterol affect me? If you have high cholesterol, deposits (plaques) may build up on the walls of your arteries. The arteries are the blood vessels that carry blood away from your heart. Plaques make the arteries narrower and stiffer. This can limit or block blood flow and cause blood clots to form. Blood clots:  Are tiny balls of cells that form in your blood.  Can move to the heart or brain, causing a heart attack or stroke. Plaques in arteries greatly increase your risk for heart attack and stroke.Making diet and lifestyle changes can reduce your risk for these conditions that may threaten your life. What can increase my risk? This condition is more likely to develop in people who:  Eat foods that are high in saturated fat or cholesterol. Saturated fat is mostly found in: ? Foods that contain animal fat, such as red meat and some dairy products. ? Certain fatty foods made from plants, such as tropical oils.  Are overweight.  Are not getting enough exercise.  Have a family history of high cholesterol. What actions can I take to prevent this? Nutrition   Eat less saturated fat.  Avoid trans fats (partially hydrogenated oils). These are often found in margarine and in some baked goods, fried foods, and snacks bought in packages.  Avoid precooked or cured meat, such as sausages or meat loaves.  Avoid foods and drinks that have added  sugars.  Eat more fruits, vegetables, and whole grains.  Choose healthy sources of protein, such as fish, poultry, lean cuts of red meat, beans, peas, lentils, and nuts.  Choose healthy sources of fat, such as: ? Nuts. ? Vegetable oils, especially olive oil. ? Fish that have healthy fats (omega-3 fatty acids), such as mackerel or salmon. The items listed above may not be a complete list of recommended foods and beverages. Contact a dietitian for more information. Lifestyle  Lose weight if you are overweight. Losing 5-10 lb (2.3-4.5 kg) can help prevent or control high cholesterol. It can also lower your risk for diabetes and high blood pressure. Ask your health care provider to help you with a diet and exercise plan to lose weight safely.  Do not use any products that contain nicotine or tobacco, such as cigarettes, e-cigarettes, and chewing tobacco. If you need help quitting, ask your health care provider.  Limit your alcohol intake. ? Do not drink alcohol if:  Your health care provider tells you not to drink.  You are pregnant, may be pregnant, or are planning to become pregnant. ? If you drink alcohol:  Limit how much you use to:  0-1 drink a day for women.  0-2 drinks a day for men.  Be aware of how much alcohol is in your drink. In the [place] one drink equals one 12 oz bottle of beer (355 mL), one 5 oz glass of wine (148 mL), or one 1 oz glass of hard liquor (44 mL). Activity   Get enough exercise. Each week, do at   least 150 minutes of exercise that takes a medium level of effort (moderate-intensity exercise). ? This is exercise that:  Makes your heart beat faster and makes you breathe harder than usual.  Allows you to still be able to talk. ? You could exercise in short sessions several times a day or longer sessions a few times a week. For example, on 5 days each week, you could walk fast or ride your bike 3 times a day for 10 minutes each time.  Do exercises as told  by your health care provider. Medicines  In addition to diet and lifestyle changes, your health care provider may recommend medicines to help lower cholesterol. This may be a medicine to lower the amount of cholesterol your liver makes. You may need medicine if: ? Diet and lifestyle changes do not lower your cholesterol enough. ? You have high cholesterol and other risk factors for heart disease or stroke.  Take over-the-counter and prescription medicines only as told by your health care provider. General information  Manage your risk factors for high cholesterol. Talk with your health care provider about all your risk factors and how to lower your risk.  Manage other conditions that you have, such as diabetes or high blood pressure (hypertension).  Have blood tests to check your cholesterol levels at regular points in time as told by your health care provider.  Keep all follow-up visits as told by your health care provider. This is important. Where to find more information  American Heart Association: www.heart.org  National Heart, Lung, and Blood Institute: www.nhlbi.nih.gov Summary  High cholesterol increases your risk for heart disease and stroke. By keeping your cholesterol level low, you can reduce your risk for these conditions.  High cholesterol can often be prevented with diet and lifestyle changes.  Work with your health care provider to manage your risk factors, and have your blood tested regularly. This information is not intended to replace advice given to you by your health care provider. Make sure you discuss any questions you have with your health care provider. Document Revised: 08/29/2018 Document Reviewed: 01/14/2016 Elsevier Patient Education  2020 [employer]  

## 2019-12-10 NOTE — Assessment & Plan Note (Signed)
Chronic, ongoing.  Continue current medication regimen and adjust as needed.  Refills sent in.  Lipid panel today.  Return in 6 months.

## 2019-12-10 NOTE — Progress Notes (Signed)
BP 132/78   Pulse (!) 58   Temp 97.8 F (36.6 C) (Oral)   Wt (!) 301 lb 9.6 oz (136.8 kg)   SpO2 98%   BMI 39.79 kg/m    Subjective:    Patient ID: Christopher Hull, male    DOB: 1965/08/31, 54 y.o.   MRN: [id number]  HPI: Christopher Hull is a 54 y.o. male  Chief Complaint  Patient presents with  . Gastroesophageal Reflux  . Hyperlipidemia  . Hypertension   HYPERTENSION / HYPERLIPIDEMIA Continues on Amlodipine 10 MG, Losartan 100 MG, and Atorvastatin 40 MG daily.  Has CPAP at home and uses 100% of time. Satisfied with current treatment? yes Duration of hypertension: chronic BP monitoring frequency: not checking BP range:  BP medication side effects: no Duration of hyperlipidemia: chronic Cholesterol medication side effects: no Cholesterol supplements: none Medication compliance: good compliance Aspirin: no Recent stressors: no Recurrent headaches: no Visual changes: no Palpitations: no Dyspnea: no Chest pain: no Lower extremity edema: occasional if drinks too much fluid Dizzy/lightheaded: no  The 10-year ASCVD risk score Denman George DC Jr., et al., 2013) is: 5%   Values used to calculate the score:     Age: 13 years     Sex: Male     Is Non-Hispanic African American: No     Diabetic: No     Tobacco smoker: No     Systolic Blood Pressure: 132 mmHg     Is BP treated: Yes     HDL Cholesterol: 39 mg/dL     Total Cholesterol: 137 mg/dL  GERD Continues on Omeprazole 20 MG daily. GERD control status: stable  Satisfied with current treatment? yes Heartburn frequency: none Medication side effects: no  Medication compliance: stable Previous GERD medications: none Antacid use frequency:   Dysphagia: no Odynophagia:  no Hematemesis: no Blood in stool: no EGD: no   Relevant past medical, surgical, family and social history reviewed and updated as indicated. Interim medical history since our last visit reviewed. Allergies and medications reviewed and  updated.  Review of Systems  Constitutional: Negative for activity change, diaphoresis, fatigue and fever.  Respiratory: Negative for cough, chest tightness, shortness of breath and wheezing.   Cardiovascular: Negative for chest pain, palpitations and leg swelling.  Gastrointestinal: Negative.   Musculoskeletal: Negative.   Skin: Negative.   Neurological: Negative.   Psychiatric/Behavioral: Negative.     Per HPI unless specifically indicated above     Objective:    BP 132/78   Pulse (!) 58   Temp 97.8 F (36.6 C) (Oral)   Wt (!) 301 lb 9.6 oz (136.8 kg)   SpO2 98%   BMI 39.79 kg/m   Wt Readings from Last 3 Encounters:  12/10/19 (!) 301 lb 9.6 oz (136.8 kg)  06/04/18 (!) 311 lb 9.6 oz (141.3 kg)  03/04/18 (!) 313 lb 3.2 oz (142.1 kg)    Physical Exam Vitals and nursing note reviewed.  Constitutional:      General: He is awake.     Appearance: He is well-developed and well-groomed. He is morbidly obese. He is not ill-appearing.  HENT:     Head: Normocephalic and atraumatic.     Right Ear: Hearing normal. No drainage.     Left Ear: Hearing normal. No drainage.  Eyes:     General: Lids are normal.        Right eye: No discharge.        Left eye: No discharge.  Conjunctiva/sclera: Conjunctivae normal.     Pupils: Pupils are equal, round, and reactive to light.  Neck:     Thyroid: No thyromegaly.     Vascular: No carotid bruit.     Trachea: Trachea normal.  Cardiovascular:     Rate and Rhythm: Normal rate and regular rhythm.     Heart sounds: Normal heart sounds, S1 normal and S2 normal. No murmur heard.  No gallop.   Pulmonary:     Effort: Pulmonary effort is normal. No accessory muscle usage or respiratory distress.     Breath sounds: Normal breath sounds.  Abdominal:     General: Bowel sounds are normal.     Palpations: Abdomen is soft.  Musculoskeletal:        General: Normal range of motion.     Cervical back: Normal range of motion and neck supple.      Right lower leg: No edema.     Left lower leg: No edema.  Skin:    General: Skin is warm and dry.     Capillary Refill: Capillary refill takes less than 2 seconds.  Neurological:     Mental Status: He is alert and oriented to person, place, and time.  Psychiatric:        Attention and Perception: Attention normal.        Mood and Affect: Mood normal.        Speech: Speech normal.        Behavior: Behavior normal. Behavior is cooperative.        Thought Content: Thought content normal.    Results for orders placed or performed in visit on 06/04/18  Comprehensive metabolic panel  Result Value Ref Range   Glucose 87 65 - 99 mg/dL   BUN 15 6 - 24 mg/dL   Creatinine, Ser 3.81 (L) 0.76 - 1.27 mg/dL   GFR calc non Af Amer 106 >59 mL/min/1.73   GFR calc Af Amer 122 >59 mL/min/1.73   BUN/Creatinine Ratio 21 (H) 9 - 20   Sodium 142 134 - 144 mmol/L   Potassium 4.1 3.5 - 5.2 mmol/L   Chloride 105 96 - 106 mmol/L   CO2 21 20 - 29 mmol/L   Calcium 9.3 8.7 - 10.2 mg/dL   Total Protein 7.2 6.0 - 8.5 g/dL   Albumin 4.3 3.5 - 5.5 g/dL   Globulin, Total 2.9 1.5 - 4.5 g/dL   Albumin/Globulin Ratio 1.5 1.2 - 2.2   Bilirubin Total 0.7 0.0 - 1.2 mg/dL   Alkaline Phosphatase 142 (H) 39 - 117 IU/L   AST 19 0 - 40 IU/L   ALT 34 0 - 44 IU/L  Lipid panel  Result Value Ref Range   Cholesterol, Total 137 100 - 199 mg/dL   Triglycerides 76 0 - 149 mg/dL   HDL 39 (L) >82 mg/dL   VLDL Cholesterol Cal 15 5 - 40 mg/dL   LDL Calculated 83 0 - 99 mg/dL   Chol/HDL Ratio 3.5 0.0 - 5.0 ratio  CBC with Differential/Platelet  Result Value Ref Range   WBC 6.6 3.4 - 10.8 x10E3/uL   RBC 4.63 4.14 - 5.80 x10E6/uL   Hemoglobin 14.8 13.0 - 17.7 g/dL   Hematocrit 99.3 71.6 - 51.0 %   MCV 92 79 - 97 fL   MCH 32.0 26.6 - 33.0 pg   MCHC 34.7 31 - 35 g/dL   RDW 96.7 89.3 - 81.0 %   Platelets 273 150 - 450 x10E3/uL   Neutrophils 52  Not Estab. %   Lymphs 37 Not Estab. %   Monocytes 7 Not Estab. %   Eos 2 Not  Estab. %   Basos 1 Not Estab. %   Neutrophils Absolute 3.5 1 - 7 x10E3/uL   Lymphocytes Absolute 2.4 0 - 3 x10E3/uL   Monocytes Absolute 0.5 0 - 0 x10E3/uL   EOS (ABSOLUTE) 0.1 0.0 - 0.4 x10E3/uL   Basophils Absolute 0.1 0 - 0 x10E3/uL   Immature Granulocytes 1 Not Estab. %   Immature Grans (Abs) 0.0 0.0 - 0.1 x10E3/uL  TSH  Result Value Ref Range   TSH 2.580 0.450 - 4.500 uIU/mL  Urinalysis, Routine w reflex microscopic  Result Value Ref Range   Specific Gravity, UA 1.020 1.005 - 1.030   pH, UA 6.0 5.0 - 7.5   Color, UA Yellow Yellow   Appearance Ur Clear Clear   Leukocytes, UA Negative Negative   Protein, UA Negative Negative/Trace   Glucose, UA Negative Negative   Ketones, UA Negative Negative   RBC, UA Negative Negative   Bilirubin, UA Negative Negative   Urobilinogen, Ur 0.2 0.2 - 1.0 mg/dL   Nitrite, UA Negative Negative  PSA  Result Value Ref Range   Prostate Specific Ag, Serum 1.0 0.0 - 4.0 ng/mL      Assessment & Plan:   Problem List Items Addressed This Visit      Cardiovascular and Mediastinum   Essential hypertension    Chronic, stable with BP at goal today.  Continue current medication regimen and adjust as needed.  Refills sent in.  BMP today.  Recommend he monitor BP at home a few days a week and document for provider, plus bring to visits, + focus on DASH diet.  Return in 6 months.      Relevant Medications   atorvastatin (LIPITOR) 40 MG tablet   losartan (COZAAR) 100 MG tablet   amLODipine (NORVASC) 10 MG tablet   Other Relevant Orders   Basic metabolic panel     Respiratory   Sleep apnea    Continue consistent use of CPAP daily.        Other   Hyperlipidemia    Chronic, ongoing.  Continue current medication regimen and adjust as needed.  Refills sent in.  Lipid panel today.  Return in 6 months.      Relevant Medications   atorvastatin (LIPITOR) 40 MG tablet   losartan (COZAAR) 100 MG tablet   amLODipine (NORVASC) 10 MG tablet   Other  Relevant Orders   Lipid Panel w/o Chol/HDL Ratio   Morbid obesity (HCC) - Primary    BMI 39.79 with HTN.  Recommended eating smaller high protein, low fat meals more frequently and exercising 30 mins a day 5 times a week with a goal of 10-15lb weight loss in the next 3 months. Patient voiced their understanding and motivation to adhere to these recommendations.       Heart burn    Chronic, stable with Prilosec on board.  Continue current medication regimen and consider reduction in future, if poor tolerance then restart.  Mag level annually.  Refills sent.      Relevant Orders   Magnesium       Follow up plan: Return in about 6 months (around 06/11/2020) for Annual physical.

## 2019-12-11 LAB — MAGNESIUM: Magnesium: 2.1 mg/dL (ref 1.6–2.3)

## 2019-12-11 LAB — LIPID PANEL W/O CHOL/HDL RATIO
Cholesterol, Total: 137 mg/dL (ref 100–199)
HDL: 41 mg/dL (ref >39)
LDL Chol Calc (NIH): 83 mg/dL (ref 0–99)
Triglycerides: 62 mg/dL (ref 0–149)
VLDL Cholesterol Cal: 13 mg/dL (ref 5–40)

## 2019-12-11 LAB — BASIC METABOLIC PANEL
BUN/Creatinine Ratio: 29 — ABNORMAL HIGH (ref 9–20)
BUN: 19 mg/dL (ref 6–24)
CO2: 23 mmol/L (ref 20–29)
Calcium: 9.7 mg/dL (ref 8.7–10.2)
Chloride: 105 mmol/L (ref 96–106)
Creatinine, Ser: 0.65 mg/dL — ABNORMAL LOW (ref 0.76–1.27)
GFR calc Af Amer: 128 mL/min/{1.73_m2} (ref >59)
GFR calc non Af Amer: 110 mL/min/{1.73_m2} (ref >59)
Glucose: 98 mg/dL (ref 65–99)
Potassium: 4.6 mmol/L (ref 3.5–5.2)
Sodium: 142 mmol/L (ref 134–144)

## 2019-12-11 NOTE — Progress Notes (Signed)
Please let Mr. Balsam know all his labs returned within normal and good ranges.  No changes needed to medications.  Keep up the great work.  Have a wonderful day!!

## 2020-03-02 ENCOUNTER — Other Ambulatory Visit: Payer: Self-pay

## 2020-03-04 ENCOUNTER — Ambulatory Visit: Payer: Self-pay

## 2020-03-07 ENCOUNTER — Ambulatory Visit (INDEPENDENT_AMBULATORY_CARE_PROVIDER_SITE_OTHER): Payer: Self-pay | Admitting: Internal Medicine

## 2020-03-07 DIAGNOSIS — K219 Gastro-esophageal reflux disease without esophagitis: Secondary | ICD-10-CM

## 2020-03-07 DIAGNOSIS — G4733 Obstructive sleep apnea (adult) (pediatric): Secondary | ICD-10-CM

## 2020-03-07 DIAGNOSIS — I10 Essential (primary) hypertension: Secondary | ICD-10-CM

## 2020-03-07 DIAGNOSIS — Z9989 Dependence on other enabling machines and devices: Secondary | ICD-10-CM

## 2020-03-07 DIAGNOSIS — Z7189 Other specified counseling: Secondary | ICD-10-CM

## 2020-03-07 DIAGNOSIS — G2581 Restless legs syndrome: Secondary | ICD-10-CM

## 2020-03-07 MED ORDER — PRAMIPEXOLE DIHYDROCHLORIDE 1 MG PO TABS: 1.0000 mg | ORAL_TABLET | Freq: Two times a day (BID) | ORAL | 2 refills | Status: DC

## 2020-03-07 NOTE — Patient Instructions (Signed)

## 2020-03-07 NOTE — Progress Notes (Signed)
[facility] [place], [place] [address]  Pulmonary Sleep Medicine   Office Visit Note  Patient Name: Christopher Hull DOB: 03-08-1966 MRN [id number]    Chief Complaint: Obstructive Sleep Apnea visit  Brief History:  Christopher Hull is seen today for follow up The patient has a 5 year history of sleep apnea. Patient is using PAP nightly.  The patient feels more rested after sleeping with PAP.  The patient reports benefiting from PAP use. Reported sleepiness is  improved and the Epworth Sleepiness Score is 0 out of 24. The patient does take naps. The patient complains of the following: no complaints  The compliance download shows excellent  compliance with an average use time of 8.2 hours. The AHI is 4.4  The patient does not complain of limb movements disrupting sleep when on medication.He is still using pramipexole 1 mg at 8 p.m. and 1 mg at bed for his RLS but ran out a week ago. He is having great difficulty sleeping over this past week. He had his iron and ferritin checked but does not know the results and will check on this.  ROS  General: (-) fever, (-) chills, (-) night sweat Nose and Sinuses: (-) nasal stuffiness or itchiness, (-) postnasal drip, (-) nosebleeds, (-) sinus trouble. Mouth and Throat: (-) sore throat, (-) hoarseness. Neck: (-) swollen glands, (-) enlarged thyroid, (-) neck pain. Respiratory: - cough, - shortness of breath, - wheezing. Neurologic: - numbness, - tingling. Psychiatric: - anxiety, - depression   Current Medication: Outpatient Encounter Medications as of 03/07/2020  Medication Sig  . amLODipine (NORVASC) 10 MG tablet Take 1 tablet (10 mg total) by mouth daily. Needs appt for further refills  . atorvastatin (LIPITOR) 40 MG tablet Take 1 tablet (40 mg total) by mouth daily. Needs appt for further refills  . losartan (COZAAR) 100 MG tablet Take 1 tablet (100 mg total) by mouth daily. Needs appt for further refills  . omeprazole  (PRILOSEC) 20 MG capsule Take 1 capsule (20 mg total) by mouth daily.  . pramipexole (MIRAPEX) 1 MG tablet Take 1 tablet (1 mg total) by mouth 2 (two) times daily.  . [DISCONTINUED] pramipexole (MIRAPEX) 1 MG tablet Take by mouth.   No facility-administered encounter medications on file as of 03/07/2020.    Surgical History: History reviewed. No pertinent surgical history.  Medical History: Past Medical History:  Diagnosis Date  . Essential hypertension   . Hyperlipidemia   . Morbid obesity ([place])   . Sleep apnea     Family History: Non contributory to the present illness  Social History: Social History   Socioeconomic History  . Marital status: Married    Spouse name: Not on file  . Number of children: Not on file  . Years of education: Not on file  . Highest education level: Not on file  Occupational History  . Not on file  Tobacco Use  . Smoking status: Never Smoker  . Smokeless tobacco: Never Used  Vaping Use  . Vaping Use: Never used  Substance and Sexual Activity  . Alcohol use: Yes    Comment: rare  . Drug use: No  . Sexual activity: Not on file  Other Topics Concern  . Not on file  Social History Narrative  . Not on file   Social Determinants of Health   Financial Resource Strain:   . Difficulty of Paying Living Expenses: Not on file  Food Insecurity:   . Worried About [employer] of  Food in the Last Year: Not on file  . Ran Out of Food in the Last Year: Not on file  Transportation Needs:   . Lack of Transportation (Medical): Not on file  . Lack of Transportation (Non-Medical): Not on file  Physical Activity:   . Days of Exercise per Week: Not on file  . Minutes of Exercise per Session: Not on file  Stress:   . Feeling of Stress : Not on file  Social Connections:   . Frequency of Communication with Friends and Family: Not on file  . Frequency of Social Gatherings with Friends and Family: Not on file  . Attends Religious Services: Not on file   . Active Member of Clubs or Organizations: Not on file  . Attends Archivist Meetings: Not on file  . Marital Status: Not on file  Intimate Partner Violence:   . Fear of Current or Ex-Partner: Not on file  . Emotionally Abused: Not on file  . Physically Abused: Not on file  . Sexually Abused: Not on file    Vital Signs: Blood pressure (!) 176/93, pulse 63, height '6\' 2"'  (1.88 m), weight (!) 315 lb (142.9 kg), SpO2 98 %.  Examination: General Appearance: The patient is well-developed, well-nourished, and in no distress. Neck Circumference: 44 Skin: Gross inspection of skin unremarkable. Head: normocephalic, no gross deformities. Eyes: no gross deformities noted. ENT: ears appear grossly normal Neurologic: Alert and oriented. No involuntary movements.    EPWORTH SLEEPINESS SCALE:  Scale:  (0)= no chance of dozing; (1)= slight chance of dozing; (2)= moderate chance of dozing; (3)= high chance of dozing  Chance  Situtation    Sitting and reading: 0    Watching TV: 0    Sitting Inactive in public: 0    As a passenger in car: 0      Lying down to rest: 0    Sitting and talking: 0    Sitting quielty after lunch: 0    In a car, stopped in traffic: 0   TOTAL SCORE:   0 out of 24    SLEEP STUDIES:  1. PSG 09/2014 AHi 97 Sp O54mn 49%   CPAP COMPLIANCE DATA:  Date Range: 09/06/19-03/03/20  Average Daily Use: 8.2 hours  Median Use: 8.3  Compliance for > 4 Hours: 98%  AHI: 4.4 respiratory events per hour  Days Used: 178/180  Mask Leak: 4.1  95th Percentile Pressure: VPAP auto   LABS: Recent Results (from the past 2160 hour(s))  Basic metabolic panel     Status: Abnormal   Collection Time: 12/10/19 10:15 AM  Result Value Ref Range   Glucose 98 65 - 99 mg/dL   BUN 19 6 - 24 mg/dL   Creatinine, Ser 0.65 (L) 0.76 - 1.27 mg/dL   GFR calc non Af Amer 110 >59 mL/min/1.73   GFR calc Af Amer 128 >59 mL/min/1.73    Comment: **[facility] currently  reports eGFR in compliance with the current**   recommendations of the [employer] [facility] will   update reporting as new guidelines are published from the NKF-ASN   Task force.    BUN/Creatinine Ratio 29 (H) 9 - 20   Sodium 142 134 - 144 mmol/L   Potassium 4.6 3.5 - 5.2 mmol/L   Chloride 105 96 - 106 mmol/L   CO2 23 20 - 29 mmol/L   Calcium 9.7 8.7 - 10.2 mg/dL  Lipid Panel w/o Chol/HDL Ratio     Status: None  Collection Time: 12/10/19 10:15 AM  Result Value Ref Range   Cholesterol, Total 137 100 - 199 mg/dL   Triglycerides 62 0 - 149 mg/dL   HDL 41 >39 mg/dL   VLDL Cholesterol Cal 13 5 - 40 mg/dL   LDL Chol Calc (NIH) 83 0 - 99 mg/dL  Magnesium     Status: None   Collection Time: 12/10/19 10:15 AM  Result Value Ref Range   Magnesium 2.1 1.6 - 2.3 mg/dL    Radiology: DG Chest 2 View  Result Date: 06/30/2014  PRIOR REPORT IMPORTED FROM AN EXTERNAL SYSTEM  CLINICAL DATA:  54 year old male with left-sided chest pain radiating into the scapula. Pain began after he tripped over a water hose and fell backward Sunday evening. EXAM: CHEST  2 VIEW COMPARISON:  None. FINDINGS: Heart is upper limits of normal for size. Mediastinal contours are within normal limits. Mild central bronchitic change favored to be chronic. No evidence of pulmonary edema, focal airspace consolidation, pleural effusion or pneumothorax. No displaced fracture identified. IMPRESSION: 1. No acute cardiopulmonary process. 2. Mild central bronchitic change. Electronically Signed   By: Jacqulynn Cadet M.D.   On: 06/30/2014 14:22     Assessment and Plan: Patient Active Problem List   Diagnosis Date Noted  . Heart burn 12/10/2019  . Morbid obesity ([place]) 04/07/2015  . Sleep apnea 04/07/2015  . Essential hypertension   . Hyperlipidemia       The patient does tolerate PAP and reports benefting benefit from PAP use. The is caring for his CPAP as recommended. He will need a refill of his  pramipexole.The patient was also counselled on the importance of weight loss in treating the sleep apnea. The compliance is excellent. The apnea is controlled.   1. OSA on CPAP Continue with current CPAP settings and excellent compliance.  2. CPAP use counseling Discussed importance of adequate CPAP use as well as proper care and cleaning techniques of machine and all supplies.  3. Gastroesophageal reflux disease without esophagitis Symptoms remain stable at this time, continue with current therapy and routine monitoring  4. RLS (restless legs syndrome) Will order iron and ferritin studies, may need supplementation Also refill his Mirapex, continue with 1 mg at dinner and 1 mg before bed, reports his RLS is controlled on this dose, has been without his medication for 1 week and has severe symptoms without it - Fe+TIBC+Fer  5. Essential hypertension Discussed his elevated BP today, encouraged to monitor his BP and HR and to follow-up with PCP as indicated  6. Morbid obesity ([place]) Discussed importance of weight management and the positive impact this will have on overall health. Weight loss strategies such as healthy eating habits as well as daily exercise as tolerated.  General Counseling: I have discussed the findings of the evaluation and examination with Christopher Hull.  I have also discussed any further diagnostic evaluation thatmay be needed or ordered today. Christopher Hull verbalizes understanding of the findings of todays visit. We also reviewed his medications today and discussed drug interactions and side effects including but not limited excessive drowsiness and altered mental states. We also discussed that there is always a risk not just to him but also people around him. he has been encouraged to call the office with any questions or concerns that should arise related to todays visit.  Orders Placed This Encounter  Procedures  . Fe+TIBC+Fer    I have personally obtained a history, examined the  patient, evaluated laboratory and imaging results, formulated the  assessment and plan and placed orders.  This patient was seen by Theodoro Grist AGNP-C in Collaboration with Dr. Devona Konig as a part of collaborative care agreement.  Richelle Ito Saunders Glance, PhD, FAASM  Diplomate, American Board of Sleep Medicine    Allyne Gee, MD [facility] Diplomate ABMS Pulmonary and Critical Care Medicine Sleep medicine

## 2020-09-02 NOTE — Progress Notes (Signed)
[facility] [address] [place], [place] [address]  Pulmonary Sleep Medicine   Office Visit Note  Patient Name: Christopher Hull DOB: Mar 01, 1966 MRN [id number]    Chief Complaint: Obstructive Sleep Apnea visit  Brief History:  Christopher Hull is seen today for osa and restless legs.  The patient has a 6 year history of sleep apnea. Patient is using PAP nightly.  The patient feels better after sleeping with PAP.  The patient reports no complaints  from PAP use. Reported sleepiness is  Not a problem  and the Epworth Sleepiness Score is 0 out of 24. The patient does not  take naps. The patient complains of the following: restless legs   The compliance download shows  compliance with an average use time of 8:10 hours. The AHI is 5.5  The patient does complain  of limb movements disrupting sleep.  ROS  General: (-) fever, (-) chills, (-) night sweat Nose and Sinuses: (-) nasal stuffiness or itchiness, (-) postnasal drip, (-) nosebleeds, (-) sinus trouble. Mouth and Throat: (-) sore throat, (-) hoarseness. Neck: (-) swollen glands, (-) enlarged thyroid, (-) neck pain. Respiratory: - cough, - shortness of breath, - wheezing. Neurologic: - numbness, - tingling. Psychiatric: - anxiety, - depression   Current Medication: Outpatient Encounter Medications as of 09/05/2020  Medication Sig  . amLODipine (NORVASC) 10 MG tablet Take 1 tablet (10 mg total) by mouth daily. Needs appt for further refills  . atorvastatin (LIPITOR) 40 MG tablet Take 1 tablet (40 mg total) by mouth daily. Needs appt for further refills  . losartan (COZAAR) 100 MG tablet Take 1 tablet (100 mg total) by mouth daily. Needs appt for further refills  . omeprazole (PRILOSEC) 20 MG capsule Take 1 capsule (20 mg total) by mouth daily.  . pramipexole (MIRAPEX) 1 MG tablet Take 1 tablet (1 mg total) by mouth 2 (two) times daily.  . [DISCONTINUED] pramipexole (MIRAPEX) 1 MG tablet Take 1 tablet (1 mg total) by mouth 2 (two)  times daily.   No facility-administered encounter medications on file as of 09/05/2020.    Surgical History: History reviewed. No pertinent surgical history.  Medical History: Past Medical History:  Diagnosis Date  . Essential hypertension   . Hyperlipidemia   . Morbid obesity (HCC)   . Sleep apnea     Family History: Non contributory to the present illness  Social History: Social History   Socioeconomic History  . Marital status: Married    Spouse name: Not on file  . Number of children: Not on file  . Years of education: Not on file  . Highest education level: Not on file  Occupational History  . Not on file  Tobacco Use  . Smoking status: Never Smoker  . Smokeless tobacco: Never Used  Vaping Use  . Vaping Use: Never used  Substance and Sexual Activity  . Alcohol use: Yes    Comment: rare  . Drug use: No  . Sexual activity: Not on file  Other Topics Concern  . Not on file  Social History Narrative  . Not on file   Social Determinants of Health   Financial Resource Strain: Not on file  Food Insecurity: Not on file  Transportation Needs: Not on file  Physical Activity: Not on file  Stress: Not on file  Social Connections: Not on file  Intimate Partner Violence: Not on file    Vital Signs: Blood pressure (!) 154/87, pulse 63, temperature 99 F (37.2 C), temperature source Temporal,  resp. rate 16, height 6\' 2"  (1.88 m), weight (!) 323 lb (146.5 kg), SpO2 97 %.  Examination: General Appearance: The patient is well-developed, well-nourished, and in no distress. Neck Circumference: 44 Skin: Gross inspection of skin unremarkable. Head: normocephalic, no gross deformities. Eyes: no gross deformities noted. ENT: ears appear grossly normal Neurologic: Alert and oriented. No involuntary movements.    EPWORTH SLEEPINESS SCALE:  Scale:  (0)= no chance of dozing; (1)= slight chance of dozing; (2)= moderate chance of dozing; (3)= high chance of  dozing  Chance  Situtation    Sitting and reading: 0    Watching TV: 0    Sitting Inactive in public: 0    As a passenger in car: 0      Lying down to rest: 0    Sitting and talking: 0    Sitting quielty after lunch: 0    In a car, stopped in traffic: 0   TOTAL SCORE:   0 out of 24    SLEEP STUDIES:  1. PSG 09/25/14 - AHI 96.7,  SpO2 min. 49%   CPAP COMPLIANCE DATA:  Date Range: 03/05/20 - 08/31/20  Average Daily Use: 8:10 hours  Median Use: 8:11 hours  Compliance for > 4 Hours: 99%  AHI: 5.5 respiratory events per hour  Days Used: 180/180 days  Mask Leak: 6.5 lpm  95th Percentile Pressure: 24.2 / 20.2 cmH2O, PS 4cmH2O         LABS: No results found for this or any previous visit (from the past 2160 hour(s)).  Radiology: DG Chest 2 View  Result Date: 06/30/2014 * PRIOR REPORT IMPORTED FROM AN EXTERNAL SYSTEM * CLINICAL DATA:  55 year old male with left-sided chest pain radiating into the scapula. Pain began after he tripped over a water hose and fell backward Sunday evening. EXAM: CHEST  2 VIEW COMPARISON:  None. FINDINGS: Heart is upper limits of normal for size. Mediastinal contours are within normal limits. Mild central bronchitic change favored to be chronic. No evidence of pulmonary edema, focal airspace consolidation, pleural effusion or pneumothorax. No displaced fracture identified. IMPRESSION: 1. No acute cardiopulmonary process. 2. Mild central bronchitic change. Electronically Signed   By: Tuesday M.D.   On: 06/30/2014 14:22     No results found.  No results found.    Assessment and Plan: Patient Active Problem List   Diagnosis Date Noted  . Heart burn 12/10/2019  . Morbid obesity (HCC) 04/07/2015  . Sleep apnea 04/07/2015  . Essential hypertension   . Hyperlipidemia     1. Iron deficiency Will check iron studies - Fe+TIBC+Fer  2. OSA on CPAP The patient does  tolerate PAP and reports  benefit from PAP use. The  patient was reminded how to clean equipment .  The patient was also counselled on getting iron studies drawn. He will split his doses of pramipexole rather than taking both pills at once. If symptoms worsen he will call so we can change medication. . The compliance is excellent. The AHI is 5.5. Assessment: OSA: continue cpap at 17-25 cmH20.   3. CPAP use counseling CPAP Counseling: had a lengthy discussion with the patient regarding the importance of PAP therapy in management of the sleep apnea. Patient appears to understand the risk factor reduction and also understands the risks associated with untreated sleep apnea. Patient will try to make a good faith effort to remain compliant with therapy. Also instructed the patient on proper cleaning of the device including the water must be  changed daily if possible and use of distilled water is preferred. Patient understands that the machine should be regularly cleaned with appropriate recommended cleaning solutions that do not damage the PAP machine for example given white vinegar and water rinses. Other methods such as ozone treatment may not be as good as these simple methods to achieve cleaning.  4. Morbid obesity (HCC) Discussed recent weight gain.  Obesity Counseling: Had a lengthy discussion regarding patients BMI and weight issues. Patient was instructed on portion control as well as increased activity. Also discussed caloric restrictions with trying to maintain intake less than 2000 Kcal. Discussions were made in accordance with the 5As of weight management. Simple actions such as not eating late and if able to, taking a walk is suggested.  5. Essential hypertension Discussed elevated BP. He will monitor at home and f/u with PCP.   6. Restless legs syndrome- he will continue with pramipexole for now. We will check iron studies. He will call if he wishes to switch medications. He will work on splitting doses.     General Counseling: I have  discussed the findings of the evaluation and examination with Christopher Hull.  I have also discussed any further diagnostic evaluation thatmay be needed or ordered today. Christopher Hull verbalizes understanding of the findings of todays visit. We also reviewed his medications today and discussed drug interactions and side effects including but not limited excessive drowsiness and altered mental states. We also discussed that there is always a risk not just to him but also people around him. he has been encouraged to call the office with any questions or concerns that should arise related to todays visit.  Orders Placed This Encounter  Procedures  . Fe+TIBC+Fer        I have personally obtained a history, examined the patient, evaluated laboratory and imaging results, formulated the assessment and plan and placed orders.  This patient was seen today by Emmaline Kluver, PA-C in collaboration with Dr. Freda Munro.  Valentino Hue Sol Blazing, PhD, FAASM  Diplomate, American Board of Sleep Medicine    Yevonne Pax, MD [facility] Diplomate ABMS Pulmonary and Critical Care Medicine Sleep medicine

## 2020-09-05 ENCOUNTER — Ambulatory Visit (INDEPENDENT_AMBULATORY_CARE_PROVIDER_SITE_OTHER): Payer: 59 | Admitting: Internal Medicine

## 2020-09-05 VITALS — BP 154/87 | HR 63 | Temp 99.0°F | Resp 16 | Ht 74.0 in | Wt 323.0 lb

## 2020-09-05 DIAGNOSIS — G4733 Obstructive sleep apnea (adult) (pediatric): Secondary | ICD-10-CM | POA: Diagnosis not present

## 2020-09-05 DIAGNOSIS — I10 Essential (primary) hypertension: Secondary | ICD-10-CM

## 2020-09-05 DIAGNOSIS — Z7189 Other specified counseling: Secondary | ICD-10-CM

## 2020-09-05 DIAGNOSIS — G2581 Restless legs syndrome: Secondary | ICD-10-CM

## 2020-09-05 DIAGNOSIS — E611 Iron deficiency: Secondary | ICD-10-CM | POA: Diagnosis not present

## 2020-09-05 DIAGNOSIS — Z9989 Dependence on other enabling machines and devices: Secondary | ICD-10-CM

## 2020-09-05 MED ORDER — PRAMIPEXOLE DIHYDROCHLORIDE 1 MG PO TABS: 1.0000 mg | ORAL_TABLET | Freq: Two times a day (BID) | ORAL | 5 refills | Status: DC

## 2020-09-05 NOTE — Patient Instructions (Signed)

## 2020-09-12 ENCOUNTER — Ambulatory Visit: Payer: Self-pay

## 2020-09-23 ENCOUNTER — Other Ambulatory Visit: Payer: Self-pay | Admitting: Nurse Practitioner

## 2020-09-23 DIAGNOSIS — I10 Essential (primary) hypertension: Secondary | ICD-10-CM

## 2020-09-23 NOTE — Telephone Encounter (Signed)
Requested medication (s) are due for refill today: yes  Requested medication (s) are on the active medication list: yes  Last refill: 07/22/2020  Future visit scheduled: no  Notes to clinic:  Attempted to contact patient and no answer Overdue for follow up    Requested Prescriptions  Pending Prescriptions Disp Refills   losartan (COZAAR) 100 MG tablet [Pharmacy Med Name: LOSARTAN POTASSIUM 100 MG TAB] 90 tablet 0    Sig: TAKE 1 TABLET BY MOUTH ONCE DAILY.      Cardiovascular:  Angiotensin Receptor Blockers Failed - 09/23/2020  2:03 PM      Failed - Cr in normal range and within 180 days    Creatinine, Ser  Date Value Ref Range Status  12/10/2019 0.65 (L) 0.76 - 1.27 mg/dL Final          Failed - K in normal range and within 180 days    Potassium  Date Value Ref Range Status  12/10/2019 4.6 3.5 - 5.2 mmol/L Final          Failed - Last BP in normal range    BP Readings from Last 1 Encounters:  09/05/20 (!) 154/87          Failed - Valid encounter within last 6 months    Recent Outpatient Visits           9 months ago Morbid obesity (HCC)   [facility] [place], Corrie Dandy T, NP   1 year ago Essential hypertension   [facility] [place], Redge Gainer, MD   2 years ago PE (physical exam), annual   [facility] [place], Redge Gainer, MD   2 years ago Essential hypertension   [facility] [place], Redge Gainer, MD   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

## 2020-09-23 NOTE — Telephone Encounter (Signed)
Lvm to make this med refill apt. 

## 2020-09-26 NOTE — Telephone Encounter (Signed)
Lvm to make this med refill apt. 

## 2020-09-27 ENCOUNTER — Encounter: Payer: Self-pay | Admitting: Nurse Practitioner

## 2020-09-27 NOTE — Telephone Encounter (Signed)
Lvm to make this med refill apt.Sent letter 

## 2020-11-07 ENCOUNTER — Ambulatory Visit (INDEPENDENT_AMBULATORY_CARE_PROVIDER_SITE_OTHER): Payer: 59 | Admitting: Internal Medicine

## 2020-11-07 ENCOUNTER — Encounter: Payer: Self-pay | Admitting: Internal Medicine

## 2020-11-07 ENCOUNTER — Other Ambulatory Visit: Payer: Self-pay

## 2020-11-07 VITALS — BP 152/86 | HR 63 | Temp 98.0°F | Ht 72.09 in | Wt 324.0 lb

## 2020-11-07 DIAGNOSIS — R52 Pain, unspecified: Secondary | ICD-10-CM | POA: Diagnosis not present

## 2020-11-07 DIAGNOSIS — B028 Zoster with other complications: Secondary | ICD-10-CM

## 2020-11-07 LAB — URINALYSIS, ROUTINE W REFLEX MICROSCOPIC
Bilirubin, UA: NEGATIVE
Glucose, UA: NEGATIVE
Ketones, UA: NEGATIVE
Leukocytes,UA: NEGATIVE
Nitrite, UA: NEGATIVE
Protein,UA: NEGATIVE
RBC, UA: NEGATIVE
Specific Gravity, UA: >1.03 — ABNORMAL HIGH (ref 1.005–1.030)
Urobilinogen, Ur: 0.2 mg/dL (ref 0.2–1.0)
pH, UA: 5.5 (ref 5.0–7.5)

## 2020-11-07 LAB — BAYER DCA HB A1C WAIVED: HB A1C (BAYER DCA - WAIVED): 5.6 % (ref <7.0)

## 2020-11-07 MED ORDER — VALACYCLOVIR HCL 1 G PO TABS: 1000.0000 mg | ORAL_TABLET | Freq: Three times a day (TID) | ORAL | 0 refills | Status: AC

## 2020-11-07 MED ORDER — PREGABALIN 50 MG PO CAPS: 50.0000 mg | ORAL_CAPSULE | Freq: Every day | ORAL | 1 refills | Status: DC

## 2020-11-07 MED ORDER — TRIAMCINOLONE ACETONIDE 0.1 % EX OINT: 1.0000 "application " | TOPICAL_OINTMENT | Freq: Two times a day (BID) | CUTANEOUS | 0 refills | Status: DC

## 2020-11-07 MED ORDER — METHYLPREDNISOLONE 4 MG PO TBPK: ORAL_TABLET | ORAL | 0 refills | Status: DC

## 2020-11-07 NOTE — Progress Notes (Signed)
Please let pt know this was normal.

## 2020-11-07 NOTE — Progress Notes (Signed)
BP (!) 152/86   Pulse 63   Temp 98 F (36.7 C) (Oral)   Ht 6' 0.09" (1.831 m)   Wt (!) 324 lb (147 kg)   SpO2 98%   BMI 43.84 kg/m    Subjective:    Patient ID: Christopher Hull, male    DOB: 09-06-1965, 55 y.o.   MRN: [id number]  Chief Complaint  Patient presents with  . Rash    Has had rash for about a week, patient states rash is burning, itching and very painful. Started on back and is now on chest..    HPI: Christopher Hull is a 55 y.o. male  Rash This is a new (noticed this first < 1 week ago started ithcing when he showed it to his wife.) problem. The current episode started in the past 7 days.   Chief Complaint  Patient presents with  . Rash    Has had rash for about a week, patient states rash is burning, itching and very painful. Started on back and is now on chest..    Relevant past medical, surgical, family and social history reviewed and updated as indicated. Interim medical history since our last visit reviewed. Allergies and medications reviewed and updated.  Review of Systems  Skin:  Positive for rash.   Per HPI unless specifically indicated above     Objective:    BP (!) 152/86   Pulse 63   Temp 98 F (36.7 C) (Oral)   Ht 6' 0.09" (1.831 m)   Wt (!) 324 lb (147 kg)   SpO2 98%   BMI 43.84 kg/m   Wt Readings from Last 3 Encounters:  11/07/20 (!) 324 lb (147 kg)  09/05/20 (!) 323 lb (146.5 kg)  03/07/20 (!) 315 lb (142.9 kg)    Physical Exam Vitals and nursing note reviewed.  Constitutional:      General: He is not in acute distress.    Appearance: Normal appearance. He is not ill-appearing or diaphoretic.  HENT:     Head: Normocephalic and atraumatic.     Mouth/Throat:     Pharynx: No oropharyngeal exudate or posterior oropharyngeal erythema.  Cardiovascular:     Rate and Rhythm: Normal rate and regular rhythm.  Pulmonary:     Effort: No respiratory distress.     Breath sounds: No stridor. No wheezing or rhonchi.  Chest:      Chest wall: No tenderness.  Abdominal:     General: Abdomen is flat. Bowel sounds are normal.  Musculoskeletal:     Cervical back: Normal range of motion and neck supple.     Left lower leg: No edema.  Skin:    General: Skin is warm and dry.     Findings: Erythema and rash present.     Comments: Small on the left side in a dermatomal fashion has 3 patches which look to be like herpetic/herpetiform rash.  No visible vesicles noted however erythema appreciated on the base of rash   Neurological:     Mental Status: He is alert.   Results for orders placed or performed in visit on 12/10/19  Basic metabolic panel  Result Value Ref Range   Glucose 98 65 - 99 mg/dL   BUN 19 6 - 24 mg/dL   Creatinine, Ser 7.65 (L) 0.76 - 1.27 mg/dL   GFR calc non Af Amer 110 >59 mL/min/1.73   GFR calc Af Amer 128 >59 mL/min/1.73   BUN/Creatinine Ratio 29 (H) 9 - 20  Sodium 142 134 - 144 mmol/L   Potassium 4.6 3.5 - 5.2 mmol/L   Chloride 105 96 - 106 mmol/L   CO2 23 20 - 29 mmol/L   Calcium 9.7 8.7 - 10.2 mg/dL  Lipid Panel w/o Chol/HDL Ratio  Result Value Ref Range   Cholesterol, Total 137 100 - 199 mg/dL   Triglycerides 62 0 - 149 mg/dL   HDL 41 >70 mg/dL   VLDL Cholesterol Cal 13 5 - 40 mg/dL   LDL Chol Calc (NIH) 83 0 - 99 mg/dL  Magnesium  Result Value Ref Range   Magnesium 2.1 1.6 - 2.3 mg/dL        Current Outpatient Medications:  .  amLODipine (NORVASC) 10 MG tablet, Take 1 tablet (10 mg total) by mouth daily. Needs appt for further refills, Disp: 90 tablet, Rfl: 4 .  atorvastatin (LIPITOR) 40 MG tablet, Take 1 tablet (40 mg total) by mouth daily. Needs appt for further refills, Disp: 90 tablet, Rfl: 4 .  losartan (COZAAR) 100 MG tablet, TAKE 1 TABLET BY MOUTH ONCE DAILY., Disp: 30 tablet, Rfl: 0 .  omeprazole (PRILOSEC) 20 MG capsule, Take 1 capsule (20 mg total) by mouth daily., Disp: 90 capsule, Rfl: 4 .  pramipexole (MIRAPEX) 1 MG tablet, Take 1 tablet (1 mg total) by mouth 2 (two)  times daily., Disp: 120 tablet, Rfl: 5    Assessment & Plan:  Shingles.  Problem List Items Addressed This Visit   None    No orders of the defined types were placed in this encounter.    Meds ordered this encounter  Medications  . methylPREDNISolone (MEDROL DOSEPAK) 4 MG TBPK tablet    Sig: Use as directed    Dispense:  1 each    Refill:  0  . valACYclovir (VALTREX) 1000 MG tablet    Sig: Take 1 tablet (1,000 mg total) by mouth 3 (three) times daily for 7 days.    Dispense:  21 tablet    Refill:  0     Follow up plan: No follow-ups on file.

## 2020-11-08 LAB — CBC WITH DIFFERENTIAL/PLATELET
Basophils Absolute: 0 10*3/uL (ref 0.0–0.2)
Basos: 1 %
EOS (ABSOLUTE): 0.2 10*3/uL (ref 0.0–0.4)
Eos: 3 %
Hematocrit: 43.6 % (ref 37.5–51.0)
Hemoglobin: 15.1 g/dL (ref 13.0–17.7)
Immature Grans (Abs): 0 10*3/uL (ref 0.0–0.1)
Immature Granulocytes: 1 %
Lymphocytes Absolute: 1.9 10*3/uL (ref 0.7–3.1)
Lymphs: 33 %
MCH: 31.8 pg (ref 26.6–33.0)
MCHC: 34.6 g/dL (ref 31.5–35.7)
MCV: 92 fL (ref 79–97)
Monocytes Absolute: 0.6 10*3/uL (ref 0.1–0.9)
Monocytes: 10 %
Neutrophils Absolute: 3 10*3/uL (ref 1.4–7.0)
Neutrophils: 52 %
Platelets: 255 10*3/uL (ref 150–450)
RBC: 4.75 x10E6/uL (ref 4.14–5.80)
RDW: 12 % (ref 11.6–15.4)
WBC: 5.7 10*3/uL (ref 3.4–10.8)

## 2020-11-08 LAB — LIPID PANEL
Chol/HDL Ratio: 3.9 ratio (ref 0.0–5.0)
Cholesterol, Total: 149 mg/dL (ref 100–199)
HDL: 38 mg/dL — ABNORMAL LOW (ref >39)
LDL Chol Calc (NIH): 97 mg/dL (ref 0–99)
Triglycerides: 68 mg/dL (ref 0–149)
VLDL Cholesterol Cal: 14 mg/dL (ref 5–40)

## 2020-11-08 LAB — COMPREHENSIVE METABOLIC PANEL
ALT: 20 IU/L (ref 0–44)
AST: 14 IU/L (ref 0–40)
Albumin/Globulin Ratio: 1.6 (ref 1.2–2.2)
Albumin: 4.4 g/dL (ref 3.8–4.9)
Alkaline Phosphatase: 127 IU/L — ABNORMAL HIGH (ref 44–121)
BUN/Creatinine Ratio: 22 — ABNORMAL HIGH (ref 9–20)
BUN: 15 mg/dL (ref 6–24)
Bilirubin Total: 0.4 mg/dL (ref 0.0–1.2)
CO2: 20 mmol/L (ref 20–29)
Calcium: 9.2 mg/dL (ref 8.7–10.2)
Chloride: 104 mmol/L (ref 96–106)
Creatinine, Ser: 0.67 mg/dL — ABNORMAL LOW (ref 0.76–1.27)
Globulin, Total: 2.7 g/dL (ref 1.5–4.5)
Glucose: 98 mg/dL (ref 65–99)
Potassium: 4.5 mmol/L (ref 3.5–5.2)
Sodium: 141 mmol/L (ref 134–144)
Total Protein: 7.1 g/dL (ref 6.0–8.5)
eGFR: 110 mL/min/{1.73_m2} (ref >59)

## 2020-11-08 LAB — TSH: TSH: 2.04 u[IU]/mL (ref 0.450–4.500)

## 2020-11-08 LAB — PSA: Prostate Specific Ag, Serum: 1.2 ng/mL (ref 0.0–4.0)

## 2020-11-09 ENCOUNTER — Telehealth: Payer: Self-pay

## 2020-11-09 NOTE — Telephone Encounter (Signed)
Pt has an appt 6/27

## 2020-11-09 NOTE — Telephone Encounter (Signed)
Patient's wife states that he is still having a lot of nerve pain in the afternoon due to the shingles. Can the lyrica be increased so that it will help in the afternoon.

## 2020-11-09 NOTE — Telephone Encounter (Signed)
Sure he can take 2 pills at night and please have him follow up x 1 week thnx.

## 2020-11-09 NOTE — Telephone Encounter (Signed)
Copied from CRM [id number]. Topic: General - Other >> Nov 08, 2020  3:54 PM Aretta Nip wrote: Reason for CRM: Pt wife is calling as they are wanting results of labs that have come back, also he is having a fit with his shingles, pregabalin (LYRICA) 50 MG capsule 30 capsule 1 11/07/2020   Sig - Route: Take 1 capsule (50 mg total) by mouth daily. - Oral is working but it starts wearing off thru the day and the pain is not tolerable. Pls call wife Rowan Blase back at [phone] whenever possible re labs and medication

## 2020-11-09 NOTE — Telephone Encounter (Signed)
Patient notified

## 2020-11-09 NOTE — Telephone Encounter (Signed)
Lawanda returned Tiffany's call and stated to call [phone]

## 2020-11-09 NOTE — Telephone Encounter (Signed)
Tried to call patient's wife, phone went to fax sound. Will try again.

## 2020-11-14 ENCOUNTER — Other Ambulatory Visit: Payer: Self-pay

## 2020-11-14 ENCOUNTER — Encounter: Payer: Self-pay | Admitting: Internal Medicine

## 2020-11-14 ENCOUNTER — Ambulatory Visit (INDEPENDENT_AMBULATORY_CARE_PROVIDER_SITE_OTHER): Payer: 59 | Admitting: Internal Medicine

## 2020-11-14 DIAGNOSIS — B028 Zoster with other complications: Secondary | ICD-10-CM | POA: Diagnosis not present

## 2020-11-14 MED ORDER — PREGABALIN 100 MG PO CAPS: 100.0000 mg | ORAL_CAPSULE | Freq: Every evening | ORAL | 3 refills | Status: DC

## 2020-11-14 MED ORDER — OZEMPIC (0.25 OR 0.5 MG/DOSE) 2 MG/1.5ML ~~LOC~~ SOPN: 0.5000 mg | PEN_INJECTOR | SUBCUTANEOUS | 2 refills | Status: DC

## 2020-11-14 NOTE — Progress Notes (Signed)
BP 135/84   Pulse (!) 58   Temp 98.2 F (36.8 C) (Oral)   Ht 6' 0.09" (1.831 m)   Wt (!) 324 lb 6.4 oz (147.1 kg)   SpO2 95%   BMI 43.89 kg/m    Subjective:    Patient ID: Christopher Hull, male    DOB: 15-Jul-1965, 55 y.o.   MRN: [id number]  Chief Complaint  Patient presents with   Herpes Zoster    Pain is started to subside   spot on wrist    Red spot on right wrist, thinks it may be poison oak    HPI: Christopher Hull is a 55 y.o. male  Pt is here for a fu on newly diagnosed shingles. He feels better s/p valtrex and medrol dose pak continues to have pain in the area of the rash however recently increased lyrica after he had called [place] such. Pt has a veyr high BMI would like to start meds for such.   Rash This is a new problem. The current episode started 1 to 4 weeks ago. The problem has been rapidly improving since onset. Pertinent negatives include no anorexia, congestion, cough, diarrhea, eye pain, facial edema, fever, joint pain, nail changes, rhinorrhea, shortness of breath or sore throat. His past medical history is significant for varicella.   Chief Complaint  Patient presents with   Herpes Zoster    Pain is started to subside   spot on wrist    Red spot on right wrist, thinks it may be poison oak    Relevant past medical, surgical, family and social history reviewed and updated as indicated. Interim medical history since our last visit reviewed. Allergies and medications reviewed and updated.  Review of Systems  Constitutional:  Negative for fever.  HENT:  Negative for congestion, rhinorrhea and sore throat.   Eyes:  Negative for pain.  Respiratory:  Negative for cough and shortness of breath.   Gastrointestinal:  Negative for anorexia and diarrhea.  Musculoskeletal:  Negative for joint pain.  Skin:  Positive for rash. Negative for nail changes.   Per HPI unless specifically indicated above     Objective:    BP 135/84   Pulse (!) 58   Temp 98.2  F (36.8 C) (Oral)   Ht 6' 0.09" (1.831 m)   Wt (!) 324 lb 6.4 oz (147.1 kg)   SpO2 95%   BMI 43.89 kg/m   Wt Readings from Last 3 Encounters:  11/14/20 (!) 324 lb 6.4 oz (147.1 kg)  11/07/20 (!) 324 lb (147 kg)  09/05/20 (!) 323 lb (146.5 kg)    Physical Exam  Results for orders placed or performed in visit on 11/07/20  TSH  Result Value Ref Range   TSH 2.040 0.450 - 4.500 uIU/mL  PSA  Result Value Ref Range   Prostate Specific Ag, Serum 1.2 0.0 - 4.0 ng/mL  Lipid panel  Result Value Ref Range   Cholesterol, Total 149 100 - 199 mg/dL   Triglycerides 68 0 - 149 mg/dL   HDL 38 (L) >39 mg/dL   VLDL Cholesterol Cal 14 5 - 40 mg/dL   LDL Chol Calc (NIH) 97 0 - 99 mg/dL   Chol/HDL Ratio 3.9 0.0 - 5.0 ratio  CBC with Differential/Platelet  Result Value Ref Range   WBC 5.7 3.4 - 10.8 x10E3/uL   RBC 4.75 4.14 - 5.80 x10E6/uL   Hemoglobin 15.1 13.0 - 17.7 g/dL   Hematocrit 43.6 37.5 - 51.0 %  MCV 92 79 - 97 fL   MCH 31.8 26.6 - 33.0 pg   MCHC 34.6 31.5 - 35.7 g/dL   RDW 12.0 11.6 - 15.4 %   Platelets 255 150 - 450 x10E3/uL   Neutrophils 52 Not Estab. %   Lymphs 33 Not Estab. %   Monocytes 10 Not Estab. %   Eos 3 Not Estab. %   Basos 1 Not Estab. %   Neutrophils Absolute 3.0 1.4 - 7.0 x10E3/uL   Lymphocytes Absolute 1.9 0.7 - 3.1 x10E3/uL   Monocytes Absolute 0.6 0.1 - 0.9 x10E3/uL   EOS (ABSOLUTE) 0.2 0.0 - 0.4 x10E3/uL   Basophils Absolute 0.0 0.0 - 0.2 x10E3/uL   Immature Granulocytes 1 Not Estab. %   Immature Grans (Abs) 0.0 0.0 - 0.1 x10E3/uL  Comprehensive metabolic panel  Result Value Ref Range   Glucose 98 65 - 99 mg/dL   BUN 15 6 - 24 mg/dL   Creatinine, Ser 0.67 (L) 0.76 - 1.27 mg/dL   eGFR 110 >59 mL/min/1.73   BUN/Creatinine Ratio 22 (H) 9 - 20   Sodium 141 134 - 144 mmol/L   Potassium 4.5 3.5 - 5.2 mmol/L   Chloride 104 96 - 106 mmol/L   CO2 20 20 - 29 mmol/L   Calcium 9.2 8.7 - 10.2 mg/dL   Total Protein 7.1 6.0 - 8.5 g/dL   Albumin 4.4 3.8 - 4.9  g/dL   Globulin, Total 2.7 1.5 - 4.5 g/dL   Albumin/Globulin Ratio 1.6 1.2 - 2.2   Bilirubin Total 0.4 0.0 - 1.2 mg/dL   Alkaline Phosphatase 127 (H) 44 - 121 IU/L   AST 14 0 - 40 IU/L   ALT 20 0 - 44 IU/L  Urinalysis, Routine w reflex microscopic  Result Value Ref Range   Specific Gravity, UA >1.030 (H) 1.005 - 1.030   pH, UA 5.5 5.0 - 7.5   Color, UA Yellow Yellow   Appearance Ur Clear Clear   Leukocytes,UA Negative Negative   Protein,UA Negative Negative/Trace   Glucose, UA Negative Negative   Ketones, UA Negative Negative   RBC, UA Negative Negative   Bilirubin, UA Negative Negative   Urobilinogen, Ur 0.2 0.2 - 1.0 mg/dL   Nitrite, UA Negative Negative  Bayer DCA Hb A1c Waived (STAT)  Result Value Ref Range   HB A1C (BAYER DCA - WAIVED) 5.6 <7.0 %        Current Outpatient Medications:    amLODipine (NORVASC) 10 MG tablet, Take 1 tablet (10 mg total) by mouth daily. Needs appt for further refills, Disp: 90 tablet, Rfl: 4   atorvastatin (LIPITOR) 40 MG tablet, Take 1 tablet (40 mg total) by mouth daily. Needs appt for further refills, Disp: 90 tablet, Rfl: 4   losartan (COZAAR) 100 MG tablet, TAKE 1 TABLET BY MOUTH ONCE DAILY., Disp: 30 tablet, Rfl: 0   omeprazole (PRILOSEC) 20 MG capsule, Take 1 capsule (20 mg total) by mouth daily., Disp: 90 capsule, Rfl: 4   pramipexole (MIRAPEX) 1 MG tablet, Take 1 tablet (1 mg total) by mouth 2 (two) times daily., Disp: 120 tablet, Rfl: 5   pregabalin (LYRICA) 50 MG capsule, Take 1 capsule (50 mg total) by mouth daily., Disp: 30 capsule, Rfl: 1   triamcinolone ointment (KENALOG) 0.1 %, Apply 1 application topically 2 (two) times daily., Disp: 30 g, Rfl: 0   valACYclovir (VALTREX) 1000 MG tablet, Take 1 tablet (1,000 mg total) by mouth 3 (three) times daily for 7 days., Disp:  21 tablet, Rfl: 0    Assessment & Plan:  Rash sec to shingles improving, pain better is on lyrica 100 mg now. Was taking this bid cut back to q pm sec to working.    Helps with his back and shoulder pain will need to check xrays next visit. Had muscle spasms per ortho in the past. Has had good resilts with PT.       Works as a Administrator, [employer]  Will need to change to lyrica 100 mg only in the pm.  A1c normal.  Labs wnl.   2. Obesity Body mass index is 43.89 kg/m.  BMI high would like to try ozempic once a week  Recheck in 1 month with labs.    Problem List Items Addressed This Visit   None    Orders Placed This Encounter  Procedures   Comp. Metabolic Panel (12)   CBC with Differential/Platelet     Meds ordered this encounter  Medications   pregabalin (LYRICA) 100 MG capsule    Sig: Take 1 capsule (100 mg total) by mouth every evening.    Dispense:  30 capsule    Refill:  3   Semaglutide,0.25 or 0.5MG/DOS, (OZEMPIC, 0.25 OR 0.5 MG/DOSE,) 2 MG/1.5ML SOPN    Sig: Inject 0.5 mg into the skin once a week.    Dispense:  1.5 mL    Refill:  2     Follow up plan: No follow-ups on file.

## 2020-11-25 ENCOUNTER — Observation Stay (HOSPITAL_COMMUNITY)
Admission: EM | Admit: 2020-11-25 | Discharge: 2020-11-26 | DRG: 392 | Disposition: A | Discharge status: Home / Self Care | Payer: 59 | Attending: Family Medicine | Admitting: Family Medicine

## 2020-11-25 ENCOUNTER — Encounter (HOSPITAL_COMMUNITY): Payer: Self-pay | Admitting: Emergency Medicine

## 2020-11-25 ENCOUNTER — Other Ambulatory Visit: Payer: Self-pay

## 2020-11-25 DIAGNOSIS — Z79899 Other long term (current) drug therapy: Secondary | ICD-10-CM | POA: Insufficient documentation

## 2020-11-25 DIAGNOSIS — R0602 Shortness of breath: Secondary | ICD-10-CM | POA: Diagnosis not present

## 2020-11-25 DIAGNOSIS — Z20822 Contact with and (suspected) exposure to covid-19: Secondary | ICD-10-CM | POA: Diagnosis not present

## 2020-11-25 DIAGNOSIS — R109 Unspecified abdominal pain: Secondary | ICD-10-CM | POA: Diagnosis not present

## 2020-11-25 DIAGNOSIS — R140 Abdominal distension (gaseous): Secondary | ICD-10-CM | POA: Insufficient documentation

## 2020-11-25 DIAGNOSIS — K567 Ileus, unspecified: Secondary | ICD-10-CM | POA: Diagnosis present

## 2020-11-25 DIAGNOSIS — I10 Essential (primary) hypertension: Secondary | ICD-10-CM | POA: Diagnosis not present

## 2020-11-25 NOTE — ED Triage Notes (Addendum)
Pt c/o generalized abdominal pain starting yesterday. Present to ED with distended firm abdomen. States he has had increase SOB d/t distended belly. Last BM yesterday, watery. No recent abdominal surgeries. No tenderness on palpation. Bowel sound present VS WDL

## 2020-11-25 NOTE — ED Notes (Signed)
Discussed pt with MD & Verbal order for DG abdomen obtained by Dr. Jeraldine Loots.

## 2020-11-26 ENCOUNTER — Emergency Department (HOSPITAL_COMMUNITY): Payer: 59

## 2020-11-26 DIAGNOSIS — K567 Ileus, unspecified: Secondary | ICD-10-CM | POA: Diagnosis present

## 2020-11-26 LAB — CBC
HCT: 46.2 % (ref 39.0–52.0)
HCT: 46.4 % (ref 39.0–52.0)
Hemoglobin: 15.7 g/dL (ref 13.0–17.0)
Hemoglobin: 15.7 g/dL (ref 13.0–17.0)
MCH: 32.1 pg (ref 26.0–34.0)
MCH: 32.8 pg (ref 26.0–34.0)
MCHC: 33.8 g/dL (ref 30.0–36.0)
MCHC: 34 g/dL (ref 30.0–36.0)
MCV: 94.5 fL (ref 80.0–100.0)
MCV: 96.9 fL (ref 80.0–100.0)
Platelets: 302 10*3/uL (ref 150–400)
Platelets: 309 10*3/uL (ref 150–400)
RBC: 4.79 MIL/uL (ref 4.22–5.81)
RBC: 4.89 MIL/uL (ref 4.22–5.81)
RDW: 12.4 % (ref 11.5–15.5)
RDW: 12.4 % (ref 11.5–15.5)
WBC: 11.4 10*3/uL — ABNORMAL HIGH (ref 4.0–10.5)
WBC: 9.5 10*3/uL (ref 4.0–10.5)
nRBC: 0 % (ref 0.0–0.2)
nRBC: 0 % (ref 0.0–0.2)

## 2020-11-26 LAB — URINALYSIS, ROUTINE W REFLEX MICROSCOPIC
Bilirubin Urine: NEGATIVE
Glucose, UA: NEGATIVE mg/dL
Hgb urine dipstick: NEGATIVE
Ketones, ur: NEGATIVE mg/dL
Leukocytes,Ua: NEGATIVE
Nitrite: NEGATIVE
Protein, ur: NEGATIVE mg/dL
Specific Gravity, Urine: 1.033 — ABNORMAL HIGH (ref 1.005–1.030)
pH: 5 (ref 5.0–8.0)

## 2020-11-26 LAB — COMPREHENSIVE METABOLIC PANEL
ALT: 28 U/L (ref 0–44)
AST: 23 U/L (ref 15–41)
Albumin: 3.8 g/dL (ref 3.5–5.0)
Alkaline Phosphatase: 110 U/L (ref 38–126)
Anion gap: 11 (ref 5–15)
BUN: 19 mg/dL (ref 6–20)
CO2: 24 mmol/L (ref 22–32)
Calcium: 9.5 mg/dL (ref 8.9–10.3)
Chloride: 104 mmol/L (ref 98–111)
Creatinine, Ser: 0.75 mg/dL (ref 0.61–1.24)
GFR, Estimated: >60 mL/min (ref >60)
Glucose, Bld: 129 mg/dL — ABNORMAL HIGH (ref 70–99)
Potassium: 3.8 mmol/L (ref 3.5–5.1)
Sodium: 139 mmol/L (ref 135–145)
Total Bilirubin: 1.3 mg/dL — ABNORMAL HIGH (ref 0.3–1.2)
Total Protein: 7.2 g/dL (ref 6.5–8.1)

## 2020-11-26 LAB — BASIC METABOLIC PANEL
Anion gap: 9 (ref 5–15)
BUN: 20 mg/dL (ref 6–20)
CO2: 29 mmol/L (ref 22–32)
Calcium: 9.3 mg/dL (ref 8.9–10.3)
Chloride: 103 mmol/L (ref 98–111)
Creatinine, Ser: 0.79 mg/dL (ref 0.61–1.24)
GFR, Estimated: >60 mL/min (ref >60)
Glucose, Bld: 120 mg/dL — ABNORMAL HIGH (ref 70–99)
Potassium: 3.8 mmol/L (ref 3.5–5.1)
Sodium: 141 mmol/L (ref 135–145)

## 2020-11-26 LAB — RESP PANEL BY RT-PCR (FLU A&B, COVID) ARPGX2
Influenza A by PCR: NEGATIVE
Influenza B by PCR: NEGATIVE
SARS Coronavirus 2 by RT PCR: NEGATIVE

## 2020-11-26 LAB — HIV ANTIBODY (ROUTINE TESTING W REFLEX): HIV Screen 4th Generation wRfx: NONREACTIVE

## 2020-11-26 LAB — PHOSPHORUS: Phosphorus: 3.3 mg/dL (ref 2.5–4.6)

## 2020-11-26 LAB — MAGNESIUM: Magnesium: 2.3 mg/dL (ref 1.7–2.4)

## 2020-11-26 LAB — LIPASE, BLOOD: Lipase: 42 U/L (ref 11–51)

## 2020-11-26 MED ORDER — BISACODYL 10 MG RE SUPP: 10.0000 mg | Freq: Every day | RECTAL | Status: DC | PRN

## 2020-11-26 MED ORDER — SIMETHICONE 80 MG PO CHEW
80.0000 mg | CHEWABLE_TABLET | Freq: Four times a day (QID) | ORAL | Status: DC
  Administered 2020-11-26 (×2): 80 mg via ORAL
  Filled 2020-11-26 (×2): qty 1

## 2020-11-26 MED ORDER — SIMETHICONE 80 MG PO CHEW: 80.0000 mg | CHEWABLE_TABLET | Freq: Four times a day (QID) | ORAL | 0 refills | Status: DC

## 2020-11-26 MED ORDER — ENOXAPARIN SODIUM 80 MG/0.8ML IJ SOSY
75.0000 mg | PREFILLED_SYRINGE | INTRAMUSCULAR | Status: DC
  Filled 2020-11-26: qty 0.75

## 2020-11-26 MED ORDER — PREGABALIN 100 MG PO CAPS: 100.0000 mg | ORAL_CAPSULE | Freq: Every evening | ORAL | Status: DC

## 2020-11-26 MED ORDER — IOHEXOL 300 MG/ML  SOLN
100.0000 mL | Freq: Once | INTRAMUSCULAR | Status: AC | PRN
  Administered 2020-11-26: 100 mL via INTRAVENOUS

## 2020-11-26 MED ORDER — PANTOPRAZOLE SODIUM 40 MG IV SOLR
40.0000 mg | Freq: Every day | INTRAVENOUS | Status: DC
  Administered 2020-11-26: 40 mg via INTRAVENOUS
  Filled 2020-11-26: qty 40

## 2020-11-26 MED ORDER — LOSARTAN POTASSIUM 100 MG PO TABS: 100.0000 mg | ORAL_TABLET | Freq: Every day | ORAL | Status: DC

## 2020-11-26 MED ORDER — LACTATED RINGERS IV SOLN: INTRAVENOUS | Status: DC

## 2020-11-26 NOTE — ED Notes (Addendum)
 bladder scan Pt stated that he urinated prior to bladder scan

## 2020-11-26 NOTE — Consult Note (Signed)
Reason for Consult:Ileus Referring Physician: [name] is an 55 y.o. male.  HPI: This is a 56 year old male with morbid obesity, HTN, OSA who presented with one day of abdominal distention and discomfort.  No nausea or vomiting.  Passing flatus.  Two BM yesterday.  Recently started Ozempic injections for DM2.  CT scan showed dilated SB but also air in the colon.  We were asked to see the patient.  He is more comfortable now and is passing gas.  Past Medical History:  Diagnosis Date   Essential hypertension    Hyperlipidemia    Morbid obesity (HCC)    Sleep apnea     History reviewed. No pertinent surgical history.  Family History  Problem Relation Age of Onset   Hypertension Mother    Dementia Mother    Hypertension Father    Diabetes Maternal Uncle     Social History:  reports that he has never smoked. He has never used smokeless tobacco. He reports current alcohol use. He reports that he does not use drugs.  Allergies:  Allergies  Allergen Reactions   Penicillins     Medications:  Prior to Admission medications   Medication Sig Start Date End Date Taking? Authorizing Provider  acetaminophen (TYLENOL) 500 MG tablet Take 1,000 mg by mouth every 6 (six) hours as needed for moderate pain or headache.   Yes [provider]  amLODipine (NORVASC) 10 MG tablet Take 1 tablet (10 mg total) by mouth daily. Needs appt for further refills Patient taking differently: Take 10 mg by mouth daily. 12/10/19  Yes Cannady, Jolene T, NP  atorvastatin (LIPITOR) 40 MG tablet Take 1 tablet (40 mg total) by mouth daily. Needs appt for further refills Patient taking differently: Take 40 mg by mouth daily. 12/10/19  Yes Cannady, Jolene T, NP  ibuprofen (ADVIL) 200 MG tablet Take 800 mg by mouth every 6 (six) hours as needed for headache or moderate pain.   Yes [provider]  losartan (COZAAR) 100 MG tablet TAKE 1 TABLET BY MOUTH ONCE DAILY. Patient taking  differently: Take 100 mg by mouth daily. 09/27/20  Yes Cannady, Jolene T, NP  omeprazole (PRILOSEC) 20 MG capsule Take 1 capsule (20 mg total) by mouth daily. 12/10/19  Yes Cannady, Jolene T, NP  pramipexole (MIRAPEX) 1 MG tablet Take 1 tablet (1 mg total) by mouth 2 (two) times daily. Patient taking differently: Take 2 mg by mouth at bedtime. 09/05/20  Yes Alvester Chou, PA-C  pregabalin (LYRICA) 100 MG capsule Take 1 capsule (100 mg total) by mouth every evening. 11/14/20  Yes Vigg, Avanti, MD  Semaglutide,0.25 or 0.5MG /DOS, (OZEMPIC, 0.25 OR 0.5 MG/DOSE,) 2 MG/1.5ML SOPN Inject 0.5 mg into the skin once a week. Patient taking differently: Inject 0.5 mg into the skin every Tuesday. 11/14/20  Yes Vigg, Avanti, MD  triamcinolone ointment (KENALOG) 0.1 % Apply 1 application topically 2 (two) times daily. Patient not taking: Reported on 11/26/2020 11/07/20   Loura Pardon, MD     Results for orders placed or performed during the hospital encounter of 11/25/20 (from the past 48 hour(s))  Urinalysis, Routine w reflex microscopic Urine, Clean Catch     Status: Abnormal   Collection Time: 11/25/20 11:36 PM  Result Value Ref Range   Color, Urine AMBER (A) YELLOW    Comment: BIOCHEMICALS MAY BE AFFECTED BY COLOR   APPearance HAZY (A) CLEAR   Specific Gravity, Urine 1.033 (H) 1.005 - 1.030  pH 5.0 5.0 - 8.0   Glucose, UA NEGATIVE NEGATIVE mg/dL   Hgb urine dipstick NEGATIVE NEGATIVE   Bilirubin Urine NEGATIVE NEGATIVE   Ketones, ur NEGATIVE NEGATIVE mg/dL   Protein, ur NEGATIVE NEGATIVE mg/dL   Nitrite NEGATIVE NEGATIVE   Leukocytes,Ua NEGATIVE NEGATIVE    Comment: Performed at [facility], [address], [place], [place] [address]  Lipase, blood     Status: None   Collection Time: 11/25/20 11:43 PM  Result Value Ref Range   Lipase 42 11 - 51 U/L    Comment: Performed at [facility], [address], [place], [place] [address]  Comprehensive metabolic panel     Status: Abnormal    Collection Time: 11/25/20 11:43 PM  Result Value Ref Range   Sodium 139 135 - 145 mmol/L   Potassium 3.8 3.5 - 5.1 mmol/L   Chloride 104 98 - 111 mmol/L   CO2 24 22 - 32 mmol/L   Glucose, Bld 129 (H) 70 - 99 mg/dL    Comment: Glucose reference range applies only to samples taken after fasting for at least 8 hours.   BUN 19 6 - 20 mg/dL   Creatinine, Ser 5.42 0.61 - 1.24 mg/dL   Calcium 9.5 8.9 - 70.6 mg/dL   Total Protein 7.2 6.5 - 8.1 g/dL   Albumin 3.8 3.5 - 5.0 g/dL   AST 23 15 - 41 U/L   ALT 28 0 - 44 U/L   Alkaline Phosphatase 110 38 - 126 U/L   Total Bilirubin 1.3 (H) 0.3 - 1.2 mg/dL   GFR, Estimated >23 >76 mL/min    Comment: (NOTE) Calculated using the CKD-EPI Creatinine Equation (2021)    Anion gap 11 5 - 15    Comment: Performed at [facility], [address], [place], [place] [address]  CBC     Status: Abnormal   Collection Time: 11/25/20 11:43 PM  Result Value Ref Range   WBC 11.4 (H) 4.0 - 10.5 K/uL   RBC 4.89 4.22 - 5.81 MIL/uL   Hemoglobin 15.7 13.0 - 17.0 g/dL   HCT 17.6 16.0 - 73.7 %   MCV 94.5 80.0 - 100.0 fL   MCH 32.1 26.0 - 34.0 pg   MCHC 34.0 30.0 - 36.0 g/dL   RDW 10.6 26.9 - 48.5 %   Platelets 309 150 - 400 K/uL   nRBC 0.0 0.0 - 0.2 %    Comment: Performed at [facility], [address] [place], [place] [address]  CBC     Status: None   Collection Time: 11/26/20  3:57 AM  Result Value Ref Range   WBC 9.5 4.0 - 10.5 K/uL   RBC 4.79 4.22 - 5.81 MIL/uL   Hemoglobin 15.7 13.0 - 17.0 g/dL   HCT 35.0 09.3 - 81.8 %   MCV 96.9 80.0 - 100.0 fL   MCH 32.8 26.0 - 34.0 pg   MCHC 33.8 30.0 - 36.0 g/dL   RDW 29.9 37.1 - 69.6 %   Platelets 302 150 - 400 K/uL   nRBC 0.0 0.0 - 0.2 %    Comment: Performed at [facility], [address], [place], [place] [address]  Basic metabolic panel     Status: Abnormal   Collection Time: 11/26/20  3:57 AM  Result Value Ref Range   Sodium 141 135 - 145 mmol/L   Potassium 3.8 3.5 - 5.1 mmol/L    Chloride 103 98 -  111 mmol/L   CO2 29 22 - 32 mmol/L   Glucose, Bld 120 (H) 70 - 99 mg/dL    Comment: Glucose reference range applies only to samples taken after fasting for at least 8 hours.   BUN 20 6 - 20 mg/dL   Creatinine, Ser 1.15 0.61 - 1.24 mg/dL   Calcium 9.3 8.9 - 72.6 mg/dL   GFR, Estimated >20 >35 mL/min    Comment: (NOTE) Calculated using the CKD-EPI Creatinine Equation (2021)    Anion gap 9 5 - 15    Comment: Performed at [facility], [address], [place], [place] [address]  Resp Panel by RT-PCR (Flu A&B, Covid) Nasopharyngeal Swab     Status: None   Collection Time: 11/26/20  4:39 AM   Specimen: Nasopharyngeal Swab; Nasopharyngeal(NP) swabs in vial transport medium  Result Value Ref Range   SARS Coronavirus 2 by RT PCR NEGATIVE NEGATIVE    Comment: (NOTE) SARS-CoV-2 target nucleic acids are NOT DETECTED.  The SARS-CoV-2 RNA is generally detectable in upper respiratory specimens during the acute phase of infection. The lowest concentration of SARS-CoV-2 viral copies this assay can detect is 138 copies/mL. A negative result does not preclude SARS-Cov-2 infection and should not be used as the sole basis for treatment or other patient management decisions. A negative result may occur with  improper specimen collection/handling, submission of specimen other than nasopharyngeal swab, presence of viral mutation(s) within the areas targeted by this assay, and inadequate number of viral copies(<138 copies/mL). A negative result must be combined with clinical observations, patient history, and epidemiological information. The expected result is Negative.  Fact Sheet for Patients:  BloggerCourse.com  Fact Sheet for Healthcare Providers:  SeriousBroker.it  This test is no t yet approved or cleared by the [place] FDA and  has been authorized for detection and/or diagnosis of SARS-CoV-2 by FDA under an  Emergency Use Authorization (EUA). This EUA will remain  in effect (meaning this test can be used) for the duration of the COVID-19 declaration under Section 564(b)(1) of the Act, 21 U.S.C.section 360bbb-3(b)(1), unless the authorization is terminated  or revoked sooner.       Influenza A by PCR NEGATIVE NEGATIVE   Influenza B by PCR NEGATIVE NEGATIVE    Comment: (NOTE) The Xpert Xpress SARS-CoV-2/FLU/RSV plus assay is intended as an aid in the diagnosis of influenza from Nasopharyngeal swab specimens and should not be used as a sole basis for treatment. Nasal washings and aspirates are unacceptable for Xpert Xpress SARS-CoV-2/FLU/RSV testing.  Fact Sheet for Patients: BloggerCourse.com  Fact Sheet for Healthcare Providers: SeriousBroker.it  This test is not yet approved or cleared by the [place] FDA and has been authorized for detection and/or diagnosis of SARS-CoV-2 by FDA under an Emergency Use Authorization (EUA). This EUA will remain in effect (meaning this test can be used) for the duration of the COVID-19 declaration under Section 564(b)(1) of the Act, 21 U.S.C. section 360bbb-3(b)(1), unless the authorization is terminated or revoked.  Performed at [facility], [address] [place], [place] [address]     DG Chest 2 View  Result Date: 11/26/2020 CLINICAL DATA:  55 year old male with shortness of breath EXAM: CHEST - 2 VIEW COMPARISON:  Chest radiograph dated 06/30/2014 FINDINGS: No focal consolidation, pleural effusion, pneumothorax. The cardiac silhouette is within limits. No acute osseous pathology. IMPRESSION: No active cardiopulmonary disease. Electronically Signed   By: Elgie Collard M.D.   On: 11/26/2020 00:26  DG Abdomen 1 View  Result Date: 11/26/2020 CLINICAL DATA:  55 year old male with abdominal distension. EXAM: ABDOMEN - 1 VIEW COMPARISON:  None. FINDINGS: Multiple dilated and air-filled  loops of small bowel measure up to 5.3 cm. Air is noted in the proximal transverse colon. No free air or radiopaque calculi identified. The osseous structures and soft tissues are unremarkable. IMPRESSION: Small bowel obstruction versus ileus. Electronically Signed   By: Elgie CollardArash  Radparvar M.D.   On: 11/26/2020 00:29   CT Abdomen Pelvis W Contrast  Result Date: 11/26/2020 CLINICAL DATA:  55 year old male with abdominal pain. EXAM: CT ABDOMEN AND PELVIS WITH CONTRAST TECHNIQUE: Multidetector CT imaging of the abdomen and pelvis was performed using the standard protocol following bolus administration of intravenous contrast. CONTRAST:  100mL OMNIPAQUE IOHEXOL 300 MG/ML  SOLN COMPARISON:  Abdominal radiograph dated 11/26/2020. FINDINGS: Lower chest: The visualized lung bases are clear. No intra-abdominal free air or free fluid. Hepatobiliary: Subcentimeter hypodense lesion in the left lobe of the liver is too small to characterize. The liver is otherwise unremarkable. No intrahepatic biliary dilatation. The gallbladder is unremarkable. Pancreas: Unremarkable. No pancreatic ductal dilatation or surrounding inflammatory changes. Spleen: Normal in size without focal abnormality. Adrenals/Urinary Tract: The adrenal glands are unremarkable. Nonobstructing left renal calculi measure up to 4 mm in the inferior pole of the left kidney. No hydronephrosis. The right kidney is unremarkable. The visualized ureters and urinary bladder appear unremarkable. Stomach/Bowel: There is sigmoid diverticulosis without active inflammatory changes. Loose stool noted within the colon compatible with diarrheal state. There is dilatation of proximal and mid small bowel loops measuring up to 4.3 cm. The distal small bowel loops are collapsed. There is gradual tapering in the right lower quadrant likely representing an ileus. A small-bowel obstruction is not excluded. Small-bowel series study may provide better evaluation. The appendix is normal.  Vascular/Lymphatic: Mild aortoiliac atherosclerotic disease. The IVC is unremarkable. No portal venous gas. There is no adenopathy. Reproductive: The prostate and seminal vesicles are grossly unremarkable. No pelvic mass. Other: None Musculoskeletal: No acute or significant osseous findings. IMPRESSION: 1. Ileus versus possible small-bowel obstruction with transition in the right lower quadrant. Small-bowel series study may provide better evaluation. 2. Sigmoid diverticulosis. 3. Nonobstructing left renal calculi. No hydronephrosis. 4. Aortic Atherosclerosis (ICD10-I70.0). Electronically Signed   By: Elgie CollardArash  Radparvar M.D.   On: 11/26/2020 03:18    Review of Systems  Eyes: Negative.   Gastrointestinal:  Positive for abdominal distention and abdominal pain.  Neurological: Negative.   Blood pressure 121/76, pulse 69, temperature 98.6 F (37 C), resp. rate 17, height 6' (1.829 m), weight (!) 147 kg, SpO2 95 %. Physical Exam Constitutional:  WDWN in NAD, conversant, no obvious deformities; lying in bed comfortably Eyes:  Pupils equal, round; sclera anicteric; moist conjunctiva; no lid lag HENT:  Oral mucosa moist; good dentition  Neck:  No masses palpated, trachea midline; no thyromegaly Lungs:  CTA bilaterally; normal respiratory effort CV:  Regular rate and rhythm; no murmurs; extremities well-perfused with no edema Abd:  +bowel sounds, obese, soft, non-tender no palpable organomegaly; no palpable hernias Musc:  Unable to assess gait; no apparent clubbing or cyanosis in extremities Lymphatic:  No palpable cervical or axillary lymphadenopathy Skin:  Warm, dry; no sign of jaundice Psychiatric - alert and oriented x 4; calm mood and affect  Assessment/Plan: No clinical or radiologic signs of SBO - passing flatus, bowel movements; air in colon  Recs:  No surgical indications.   Advance diet as tolerated and  discharge per TR[facility] No follow-up needed Will sign off  Christopher Hull 11/26/2020, 7:48 AM

## 2020-11-26 NOTE — ED Notes (Signed)
Pt ambulated to restroom at this time. Provided UA cup to obtain a UA sample at this time as well

## 2020-11-26 NOTE — ED Provider Notes (Signed)
[facility] EMERGENCY DEPARTMENT Provider Note   CSN: [id number] Arrival date & time: 11/25/20  2303     History Chief Complaint  Patient presents with   Abdominal Pain    Christopher Hull is a 55 y.o. male.  Patient presents chief complaint of abdominal pain ongoing for 1 day.  Describes as diffuse abdominal tightness and swelling sensation.  He states that he has had not had any vomiting.  He had a bowel movement yesterday.  Denies any prior abdominal surgeries.  No fever no cough.      Past Medical History:  Diagnosis Date   Essential hypertension    Hyperlipidemia    Morbid obesity (HCC)    Sleep apnea     Patient Active Problem List   Diagnosis Date Noted   Ileus (HCC) 11/26/2020   Heart burn 12/10/2019   Morbid obesity (HCC) 04/07/2015   Sleep apnea 04/07/2015   Essential hypertension    Hyperlipidemia     History reviewed. No pertinent surgical history.     Family History  Problem Relation Age of Onset   Hypertension Mother    Dementia Mother    Hypertension Father    Diabetes Maternal Uncle     Social History   Tobacco Use   Smoking status: Never   Smokeless tobacco: Never  Vaping Use   Vaping Use: Never used  Substance Use Topics   Alcohol use: Yes    Comment: rare   Drug use: No    Home Medications Prior to Admission medications   Medication Sig Start Date End Date Taking? Authorizing Provider  acetaminophen (TYLENOL) 500 MG tablet Take 1,000 mg by mouth every 6 (six) hours as needed for moderate pain or headache.   Yes [provider]  amLODipine (NORVASC) 10 MG tablet Take 1 tablet (10 mg total) by mouth daily. Needs appt for further refills Patient taking differently: Take 10 mg by mouth daily. 12/10/19  Yes Cannady, Jolene T, NP  atorvastatin (LIPITOR) 40 MG tablet Take 1 tablet (40 mg total) by mouth daily. Needs appt for further refills Patient taking differently: Take 40 mg by mouth daily. 12/10/19  Yes  Cannady, Jolene T, NP  ibuprofen (ADVIL) 200 MG tablet Take 800 mg by mouth every 6 (six) hours as needed for headache or moderate pain.   Yes [provider]  losartan (COZAAR) 100 MG tablet TAKE 1 TABLET BY MOUTH ONCE DAILY. Patient taking differently: Take 100 mg by mouth daily. 09/27/20  Yes Cannady, Jolene T, NP  omeprazole (PRILOSEC) 20 MG capsule Take 1 capsule (20 mg total) by mouth daily. 12/10/19  Yes Cannady, Jolene T, NP  pramipexole (MIRAPEX) 1 MG tablet Take 1 tablet (1 mg total) by mouth 2 (two) times daily. Patient taking differently: Take 2 mg by mouth at bedtime. 09/05/20  Yes Alvester Chou, PA-C  pregabalin (LYRICA) 100 MG capsule Take 1 capsule (100 mg total) by mouth every evening. 11/14/20  Yes Vigg, Avanti, MD  Semaglutide,0.25 or 0.5MG /DOS, (OZEMPIC, 0.25 OR 0.5 MG/DOSE,) 2 MG/1.5ML SOPN Inject 0.5 mg into the skin once a week. Patient taking differently: Inject 0.5 mg into the skin every Tuesday. 11/14/20  Yes Vigg, Avanti, MD  triamcinolone ointment (KENALOG) 0.1 % Apply 1 application topically 2 (two) times daily. Patient not taking: Reported on 11/26/2020 11/07/20   Loura Pardon, MD    Allergies    Penicillins  Review of Systems   Review of Systems  Constitutional:  Negative for  fever.  HENT:  Negative for ear pain and sore throat.   Eyes:  Negative for pain.  Respiratory:  Negative for cough.   Cardiovascular:  Negative for chest pain.  Gastrointestinal:  Positive for abdominal pain.  Genitourinary:  Negative for flank pain.  Musculoskeletal:  Negative for back pain.  Skin:  Negative for color change and rash.  Neurological:  Negative for syncope.  All other systems reviewed and are negative.  Physical Exam Updated Vital Signs BP 120/72   Pulse 74   Temp 98.6 F (37 C)   Resp 16   Ht 6' (1.829 m)   Wt (!) 147 kg   SpO2 95%   BMI 43.94 kg/m   Physical Exam Constitutional:      General: He is not in acute distress.    Appearance: He is  well-developed.  HENT:     Head: Normocephalic.     Nose: Nose normal.  Eyes:     Extraocular Movements: Extraocular movements intact.  Cardiovascular:     Rate and Rhythm: Normal rate.  Pulmonary:     Effort: Pulmonary effort is normal.  Abdominal:     General: There is distension.     Tenderness: There is abdominal tenderness.     Comments: Mild diffuse tenderness on exam.  Skin:    Coloration: Skin is not jaundiced.  Neurological:     Mental Status: He is alert. Mental status is at baseline.    ED Results / Procedures / Treatments   Labs (all labs ordered are listed, but only abnormal results are displayed) Labs Reviewed  COMPREHENSIVE METABOLIC PANEL - Abnormal; Notable for the following components:      Result Value   Glucose, Bld 129 (*)    Total Bilirubin 1.3 (*)    All other components within normal limits  CBC - Abnormal; Notable for the following components:   WBC 11.4 (*)    All other components within normal limits  URINALYSIS, ROUTINE W REFLEX MICROSCOPIC - Abnormal; Notable for the following components:   Color, Urine AMBER (*)    APPearance HAZY (*)    Specific Gravity, Urine 1.033 (*)    All other components within normal limits  RESP PANEL BY RT-PCR (FLU A&B, COVID) ARPGX2  LIPASE, BLOOD  HIV ANTIBODY (ROUTINE TESTING W REFLEX)  CBC  BASIC METABOLIC PANEL  MAGNESIUM  PHOSPHORUS    EKG None  Radiology DG Chest 2 View  Result Date: 11/26/2020 CLINICAL DATA:  55 year old male with shortness of breath EXAM: CHEST - 2 VIEW COMPARISON:  Chest radiograph dated 06/30/2014 FINDINGS: No focal consolidation, pleural effusion, pneumothorax. The cardiac silhouette is within limits. No acute osseous pathology. IMPRESSION: No active cardiopulmonary disease. Electronically Signed   By: Elgie Collard M.D.   On: 11/26/2020 00:26   DG Abdomen 1 View  Result Date: 11/26/2020 CLINICAL DATA:  55 year old male with abdominal distension. EXAM: ABDOMEN - 1 VIEW  COMPARISON:  None. FINDINGS: Multiple dilated and air-filled loops of small bowel measure up to 5.3 cm. Air is noted in the proximal transverse colon. No free air or radiopaque calculi identified. The osseous structures and soft tissues are unremarkable. IMPRESSION: Small bowel obstruction versus ileus. Electronically Signed   By: Elgie Collard M.D.   On: 11/26/2020 00:29   CT Abdomen Pelvis W Contrast  Result Date: 11/26/2020 CLINICAL DATA:  55 year old male with abdominal pain. EXAM: CT ABDOMEN AND PELVIS WITH CONTRAST TECHNIQUE: Multidetector CT imaging of the abdomen and pelvis was performed using  the standard protocol following bolus administration of intravenous contrast. CONTRAST:  OMNIPAQUE IOHEXOL 300 MG/ML  SOLN COMPARISON:  Abdominal radiograph dated 11/26/2020. FINDINGS: Lower chest: The visualized lung bases are clear. No intra-abdominal free air or free fluid. Hepatobiliary: Subcentimeter hypodense lesion in the left lobe of the liver is too small to characterize. The liver is otherwise unremarkable. No intrahepatic biliary dilatation. The gallbladder is unremarkable. Pancreas: Unremarkable. No pancreatic ductal dilatation or surrounding inflammatory changes. Spleen: Normal in size without focal abnormality. Adrenals/Urinary Tract: The adrenal glands are unremarkable. Nonobstructing left renal calculi measure up to 4 mm in the inferior pole of the left kidney. No hydronephrosis. The right kidney is unremarkable. The visualized ureters and urinary bladder appear unremarkable. Stomach/Bowel: There is sigmoid diverticulosis without active inflammatory changes. Loose stool noted within the colon compatible with diarrheal state. There is dilatation of proximal and mid small bowel loops measuring up to 4.3 cm. The distal small bowel loops are collapsed. There is gradual tapering in the right lower quadrant likely representing an ileus. A small-bowel obstruction is not excluded. Small-bowel series  study may provide better evaluation. The appendix is normal. Vascular/Lymphatic: Mild aortoiliac atherosclerotic disease. The IVC is unremarkable. No portal venous gas. There is no adenopathy. Reproductive: The prostate and seminal vesicles are grossly unremarkable. No pelvic mass. Other: None Musculoskeletal: No acute or significant osseous findings. IMPRESSION: 1. Ileus versus possible small-bowel obstruction with transition in the right lower quadrant. Small-bowel series study may provide better evaluation. 2. Sigmoid diverticulosis. 3. Nonobstructing left renal calculi. No hydronephrosis. 4. Aortic Atherosclerosis (ICD10-I70.0). Electronically Signed   By: Elgie Collard M.D.   On: 11/26/2020 03:18    Procedures Procedures   Medications Ordered in ED Medications  enoxaparin (LOVENOX) injection 75 mg (has no administration in time range)  simethicone (MYLICON) chewable tablet 80 mg (80 mg Oral Given 11/26/20 0428)  lactated ringers infusion ( Intravenous New Bag/Given 11/26/20 0430)  pregabalin (LYRICA) capsule 100 mg (has no administration in time range)  pantoprazole (PROTONIX) injection 40 mg (40 mg Intravenous Given 11/26/20 0427)  iohexol (OMNIPAQUE) 300 MG/ML solution 100 mL (100 mLs Intravenous Contrast Given 11/26/20 0307)    ED Course  I have reviewed the triage vital signs and the nursing notes.  Pertinent labs & imaging results that were available during my care of the patient were reviewed by me and considered in my medical decision making (see chart for details).    MDM Rules/Calculators/A&P                          Labs unremarkable.  X-Culley concerning for ileus versus obstruction.  CT imaging also concerning for ileus versus obstruction.  Case discussed with surgery who will see the patient.  Recommending medical admission given unclear diagnosis.   Final Clinical Impression(s) / ED Diagnoses Final diagnoses:  Ileus [facility])    Rx / DC Orders ED Discharge Orders      None        Cheryll Cockayne, MD 11/26/20 [phone]

## 2020-11-26 NOTE — Progress Notes (Signed)
PT CANCEL NOTE  Order received for PT. Pt was discharged straight from the ED prior to PT assessment.  He had ambulated to the bathroom with nursing staff and was cleared for DC by attending MD.   Lavona Mound, PT   Acute Rehabilitation Services  Pager [phone] Office [phone] 11/26/2020

## 2020-11-26 NOTE — H&P (Signed)
History and Physical  Christopher PastorMark Edward Matthes ZOX:[id number] DOB: 1966-03-15 DOA: 11/25/2020  Referring physician: Dr. Audley HoseHong, EDP. PCP: Loura PardonVigg, Avanti, MD  Outpatient Specialists: None. Patient coming from: Home.  Chief Complaint: Abdominal distention and discomfort x 1 day.  HPI: Christopher Hull is a 55 y.o. male with medical history significant for morbid obesity, OSA on BIPAP, hypertension, hyperlipidemia, type 2 diabetes, GERD, recent shingles on Lyrica, who presented to [facility] ED from home due to abdominal distention and discomfort x1 day.  Reports making himself vomit to improve his abdominal discomfort. Had 2 bowel movements on 11/25/20.  No prior abdominal surgeries.  No prior history of ileus or bowel obstruction.  Recently started on Ozempic for his type 2 diabetes.  Work-up in the ED revealed ileus versus SBO.  Admits to flatulence at the time of this visit.  Feels better and less distended after taking simethicone.  Hypoactive bowel sounds noted on exam.  EDP discussed case with general surgery who recommended hospitalist admission, surgery will see in consultation.  ED Course: Temperature 98.6.  BP 121/76, pulse 69, respiratory 17, O2 saturation 95% on room air.  CBC and BMP essentially unremarkable.  CT abdomen pelvis with contrast revealed: 1. Ileus versus possible small-bowel obstruction with transition in the right lower quadrant. Small-bowel series study may provide better evaluation. 2. Sigmoid diverticulosis. 3. Nonobstructing left renal calculi. No hydronephrosis. 4. Aortic Atherosclerosis (ICD10-I70.0).     Review of Systems: Review of systems as noted in the HPI. All other systems reviewed and are negative.   Past Medical History:  Diagnosis Date   Essential hypertension    Hyperlipidemia    Morbid obesity (HCC)    Sleep apnea    History reviewed. No pertinent surgical history.  Social History:  reports that he has never smoked. He has never used smokeless tobacco. He  reports current alcohol use. He reports that he does not use drugs.   Allergies  Allergen Reactions   Penicillins     Family History  Problem Relation Age of Onset   Hypertension Mother    Dementia Mother    Hypertension Father    Diabetes Maternal Uncle       Prior to Admission medications   Medication Sig Start Date End Date Taking? Authorizing Provider  acetaminophen (TYLENOL) 500 MG tablet Take 1,000 mg by mouth every 6 (six) hours as needed for moderate pain or headache.   Yes [provider]  amLODipine (NORVASC) 10 MG tablet Take 1 tablet (10 mg total) by mouth daily. Needs appt for further refills Patient taking differently: Take 10 mg by mouth daily. 12/10/19  Yes Cannady, Jolene T, NP  atorvastatin (LIPITOR) 40 MG tablet Take 1 tablet (40 mg total) by mouth daily. Needs appt for further refills Patient taking differently: Take 40 mg by mouth daily. 12/10/19  Yes Cannady, Jolene T, NP  ibuprofen (ADVIL) 200 MG tablet Take 800 mg by mouth every 6 (six) hours as needed for headache or moderate pain.   Yes [provider]  losartan (COZAAR) 100 MG tablet TAKE 1 TABLET BY MOUTH ONCE DAILY. Patient taking differently: Take 100 mg by mouth daily. 09/27/20  Yes Cannady, Jolene T, NP  omeprazole (PRILOSEC) 20 MG capsule Take 1 capsule (20 mg total) by mouth daily. 12/10/19  Yes Cannady, Jolene T, NP  pramipexole (MIRAPEX) 1 MG tablet Take 1 tablet (1 mg total) by mouth 2 (two) times daily. Patient taking differently: Take 2 mg by mouth at bedtime. 09/05/20  Yes Alvester Chou, PA-C  pregabalin (LYRICA) 100 MG capsule Take 1 capsule (100 mg total) by mouth every evening. 11/14/20  Yes Vigg, Avanti, MD  Semaglutide,0.25 or 0.5MG /DOS, (OZEMPIC, 0.25 OR 0.5 MG/DOSE,) 2 MG/1.5ML SOPN Inject 0.5 mg into the skin once a week. Patient taking differently: Inject 0.5 mg into the skin every Tuesday. 11/14/20  Yes Vigg, Avanti, MD  triamcinolone ointment (KENALOG) 0.1 % Apply 1  application topically 2 (two) times daily. Patient not taking: Reported on 11/26/2020 11/07/20   Loura Pardon, MD    Physical Exam: BP 120/72   Pulse 74   Temp 98.6 F (37 C)   Resp 16   Ht 6' (1.829 m)   Wt (!) 147 kg   SpO2 95%   BMI 43.94 kg/m   General: 55 y.o. year-old male well developed well nourished in no acute distress.  Alert and oriented x3. Cardiovascular: Regular rate and rhythm with no rubs or gallops.  No thyromegaly or JVD noted.  Trace lower extremity edema bilaterally. Respiratory: Clear to auscultation with no wheezes or rales. Good inspiratory effort. Abdomen: Distended, nontender nondistended with hypoactive bowel sounds x4 quadrants.  Zoster rash left side of chest to the left side of the back without crossing midline. Muskuloskeletal: No cyanosis or clubbing.  Trace edema noted bilaterally Neuro: CN II-XII intact, strength, sensation, reflexes Skin: No ulcerative lesions noted or rashes Psychiatry: Judgement and insight appear normal. Mood is appropriate for condition and setting          Labs on Admission:  Basic Metabolic Panel: Recent Labs  Lab 11/25/20 2343  NA 139  K 3.8  CL 104  CO2 24  GLUCOSE 129*  BUN 19  CREATININE 0.75  CALCIUM 9.5   Liver Function Tests: Recent Labs  Lab 11/25/20 2343  AST 23  ALT 28  ALKPHOS 110  BILITOT 1.3*  PROT 7.2  ALBUMIN 3.8   Recent Labs  Lab 11/25/20 2343  LIPASE 42   No results for input(s): AMMONIA in the last 168 hours. CBC: Recent Labs  Lab 11/25/20 2343  WBC 11.4*  HGB 15.7  HCT 46.2  MCV 94.5  PLT 309   Cardiac Enzymes: No results for input(s): CKTOTAL, CKMB, CKMBINDEX, TROPONINI in the last 168 hours.  BNP (last 3 results) No results for input(s): BNP in the last 8760 hours.  ProBNP (last 3 results) No results for input(s): PROBNP in the last 8760 hours.  CBG: No results for input(s): GLUCAP in the last 168 hours.  Radiological Exams on Admission: DG Chest 2  View  Result Date: 11/26/2020 CLINICAL DATA:  55 year old male with shortness of breath EXAM: CHEST - 2 VIEW COMPARISON:  Chest radiograph dated 06/30/2014 FINDINGS: No focal consolidation, pleural effusion, pneumothorax. The cardiac silhouette is within limits. No acute osseous pathology. IMPRESSION: No active cardiopulmonary disease. Electronically Signed   By: Elgie Collard M.D.   On: 11/26/2020 00:26   DG Abdomen 1 View  Result Date: 11/26/2020 CLINICAL DATA:  55 year old male with abdominal distension. EXAM: ABDOMEN - 1 VIEW COMPARISON:  None. FINDINGS: Multiple dilated and air-filled loops of small bowel measure up to 5.3 cm. Air is noted in the proximal transverse colon. No free air or radiopaque calculi identified. The osseous structures and soft tissues are unremarkable. IMPRESSION: Small bowel obstruction versus ileus. Electronically Signed   By: Elgie Collard M.D.   On: 11/26/2020 00:29   CT Abdomen Pelvis W Contrast  Result Date: 11/26/2020 CLINICAL DATA:  55 year old  male with abdominal pain. EXAM: CT ABDOMEN AND PELVIS WITH CONTRAST TECHNIQUE: Multidetector CT imaging of the abdomen and pelvis was performed using the standard protocol following bolus administration of intravenous contrast. CONTRAST:  OMNIPAQUE IOHEXOL 300 MG/ML  SOLN COMPARISON:  Abdominal radiograph dated 11/26/2020. FINDINGS: Lower chest: The visualized lung bases are clear. No intra-abdominal free air or free fluid. Hepatobiliary: Subcentimeter hypodense lesion in the left lobe of the liver is too small to characterize. The liver is otherwise unremarkable. No intrahepatic biliary dilatation. The gallbladder is unremarkable. Pancreas: Unremarkable. No pancreatic ductal dilatation or surrounding inflammatory changes. Spleen: Normal in size without focal abnormality. Adrenals/Urinary Tract: The adrenal glands are unremarkable. Nonobstructing left renal calculi measure up to 4 mm in the inferior pole of the left  kidney. No hydronephrosis. The right kidney is unremarkable. The visualized ureters and urinary bladder appear unremarkable. Stomach/Bowel: There is sigmoid diverticulosis without active inflammatory changes. Loose stool noted within the colon compatible with diarrheal state. There is dilatation of proximal and mid small bowel loops measuring up to 4.3 cm. The distal small bowel loops are collapsed. There is gradual tapering in the right lower quadrant likely representing an ileus. A small-bowel obstruction is not excluded. Small-bowel series study may provide better evaluation. The appendix is normal. Vascular/Lymphatic: Mild aortoiliac atherosclerotic disease. The IVC is unremarkable. No portal venous gas. There is no adenopathy. Reproductive: The prostate and seminal vesicles are grossly unremarkable. No pelvic mass. Other: None Musculoskeletal: No acute or significant osseous findings. IMPRESSION: 1. Ileus versus possible small-bowel obstruction with transition in the right lower quadrant. Small-bowel series study may provide better evaluation. 2. Sigmoid diverticulosis. 3. Nonobstructing left renal calculi. No hydronephrosis. 4. Aortic Atherosclerosis (ICD10-I70.0). Electronically Signed   By: Elgie Collard M.D.   On: 11/26/2020 03:18    EKG: I independently viewed the EKG done and my findings are as followed: Sinus rhythm rate of 84.  Nonspecific ST-T changes.  QTc 434.  Assessment/Plan Present on Admission:  Ileus (HCC)  Active Problems:   Ileus (HCC)  Likely ileus versus SBO Presented with abdominal discomfort and distention x1 day CT abdomen and pelvis with contrast revealed ileus versus possible small bowel obstruction with transition in the right lower quadrant.  Small bowel series study may provide better evaluation.  Sigmoid diverticulosis.  Nonobstructive left renal calculi no hydronephrosis.  Aortic atherosclerosis. Start gentle IV fluid hydration LR 50 cc/h x 2 days. Optimize  magnesium and potassium levels Mobilize as tolerated with PT OT N.p.o. except for meds and sips Obtain abdominal x-Tebbetts in the morning Patient has denied nausea at the time of this visit, will hold off on NG tube for now.  If nausea or abdominal discomfort/distention worsen, place NG tube. Simethicone for excessive gas 80 mg 4 times daily x3 days Dulcolax suppository daily as needed General surgery has been consulted by EDP.  Acute urinary retention Reports minimal urine output 4 hours Obtain bladder scan per protocol  GERD Hold off oral Protonix Start IV Protonix 40 mg daily.  OSA on BiPAP Resume BiPAP nightly  Type 2 diabetes Last hemoglobin A1c 5.6 Hold off home oral hypoglycemics. Recently started on Ozempic, hold off.  Recently diagnosed shingles affecting left side of chest through left side of back without crossing midline He is on Lyrica for postherpetic pain.  Hypertension BPs are currently soft Hold off home oral antihypertensive for now Monitor vital signs.  Severe morbid obesity BMI 43 Recommend weight loss outpatient with regular physical activity and healthy  dieting. Ambulate with PT OT   DVT prophylaxis: Subcu Lovenox daily  Code Status: Full code  Family Communication: None at bedside  Disposition Plan: Admit to telemetry medical  Consults called: General surgery consulted by EDP  Admission status: Inpatient status.  Patient will require at least 2 midnights for further evaluation and treatment of present condition.   Status is: Inpatient    Dispo:  Patient From: Home  Planned Disposition: Home, possibly on 11/28/2020 or once symptomatology has improved and patient is tolerating a diet.  Medically stable for discharge: No         Darlin Drop MD [facility] Pager [phone]  If 7PM-7AM, please contact night-coverage [email or web address] Password [email or web address]  11/26/2020, 3:56 AM

## 2020-11-26 NOTE — Discharge Summary (Signed)
Physician Discharge Summary  Christopher Hull PPI:[id number] DOB: 04/17/1966 DOA: 11/25/2020  PCP: Charlynne Cousins, MD  Admit date: 11/25/2020 Discharge date: 11/26/2020  Time spent: 26 minutes  Recommendations for Outpatient Follow-up:  Stop Ozempic in the outpatient setting reeval with PCP glycemic control, weight loss controlled Needs CBC, be met LFTs in about a week  Discharge Diagnoses:  MAIN problem for hospitalization   Possible ileus on admission  Please see below for itemized issues addressed in HOpsital- refer to other progress notes for clarity if needed  Discharge Condition: Much improved  Diet recommendation: Soft for several days and then resume with usual meds  Filed Weights   11/25/20 2334  Weight: (!) 147 kg    History of present illness:  55 year old white male Super morbid obesity BMI 43 OSA on BiPAP HTN HLD DM TY 2 Developed severe abdominal pain distention 1 day prior to admission but has been passing stool despite this Recent start Ozempic in the outpatient setting CT scan showed ileus versus SBO Took simethicone in the ED and was doing better General surgery saw the patient in the emergency room examined the patient and did not think that there were any overt concerns for either ileus or obstruction--he has continuously passed gas in the emergency room  Patient was cleared by general surgery for discharge as there was no clinical or radiological signs of SBO per them-we allowed a diet and he tolerated it without nausea vomiting I have informed the patient continue simethicone in the outpatient setting Looking up the side effects of Ozempic this can cause bloating constipation and nausea in 10 to 30% of patients therefore I have asked that the patient stop this at this time-patient is understanding this and will follow up with primary physician who ordered this for him and will see them in about 1 week  Given his hemodynamic stability and lack of further issues  he had met maximal hospital visit stay and was discharged home   Discharge Exam: Vitals:   11/26/20 0830 11/26/20 0900  BP: 125/69 130/71  Pulse: 64 69  Resp: 17 16  Temp:    SpO2: 95% 93%    Subj on day of d/c   Awake coherent pleasant Slightly sleepy has not slept overnight Passing gas still  General Exam on discharge  EOMI NCAT no focal deficit CTA B no rales no rhonchi abdomen obese nontender nondistended no rebound no guarding and bowel sounds heard S1-S2 no murmur no rub no gallop Trace lower extremity edema Neurologically intact no focal deficit ROM intact  Discharge Instructions   Discharge Instructions     Diet - low sodium heart healthy   Complete by: As directed    Discharge instructions   Complete by: As directed    Follow up with primary MD regarding Ozempic Soft diet for about 3-5 days  Resume meds slowly in am at home Labs in about 1 week   Increase activity slowly   Complete by: As directed       Allergies as of 11/26/2020       Reactions   Penicillins         Medication List     STOP taking these medications    ibuprofen 200 MG tablet Commonly known as: ADVIL   Ozempic (0.25 or 0.5 MG/DOSE) 2 MG/1.5ML Sopn Generic drug: Semaglutide(0.25 or 0.5MG/DOS)       TAKE these medications    acetaminophen 500 MG tablet Commonly known as: TYLENOL Take 1,000 mg by  mouth every 6 (six) hours as needed for moderate pain or headache.   amLODipine 10 MG tablet Commonly known as: NORVASC Take 1 tablet (10 mg total) by mouth daily. Needs appt for further refills What changed: additional instructions   atorvastatin 40 MG tablet Commonly known as: LIPITOR Take 1 tablet (40 mg total) by mouth daily. Needs appt for further refills What changed: additional instructions   losartan 100 MG tablet Commonly known as: COZAAR Take 1 tablet (100 mg total) by mouth daily.   omeprazole 20 MG capsule Commonly known as: PRILOSEC Take 1 capsule (20 mg  total) by mouth daily.   pramipexole 1 MG tablet Commonly known as: MIRAPEX Take 1 tablet (1 mg total) by mouth 2 (two) times daily. What changed:  how much to take when to take this   pregabalin 100 MG capsule Commonly known as: Lyrica Take 1 capsule (100 mg total) by mouth every evening.   simethicone 80 MG chewable tablet Commonly known as: MYLICON Chew 1 tablet (80 mg total) by mouth 4 (four) times daily.   triamcinolone ointment 0.1 % Commonly known as: KENALOG Apply 1 application topically 2 (two) times daily.       Allergies  Allergen Reactions   Penicillins       The results of significant diagnostics from this hospitalization (including imaging, microbiology, ancillary and laboratory) are listed below for reference.    Significant Diagnostic Studies: DG Chest 2 View  Result Date: 11/26/2020 CLINICAL DATA:  55 year old male with shortness of breath EXAM: CHEST - 2 VIEW COMPARISON:  Chest radiograph dated 06/30/2014 FINDINGS: No focal consolidation, pleural effusion, pneumothorax. The cardiac silhouette is within limits. No acute osseous pathology. IMPRESSION: No active cardiopulmonary disease. Electronically Signed   By: Anner Crete M.D.   On: 11/26/2020 00:26   DG Abdomen 1 View  Result Date: 11/26/2020 CLINICAL DATA:  55 year old male with abdominal distension. EXAM: ABDOMEN - 1 VIEW COMPARISON:  None. FINDINGS: Multiple dilated and air-filled loops of small bowel measure up to 5.3 cm. Air is noted in the proximal transverse colon. No free air or radiopaque calculi identified. The osseous structures and soft tissues are unremarkable. IMPRESSION: Small bowel obstruction versus ileus. Electronically Signed   By: Anner Crete M.D.   On: 11/26/2020 00:29   CT Abdomen Pelvis W Contrast  Result Date: 11/26/2020 CLINICAL DATA:  55 year old male with abdominal pain. EXAM: CT ABDOMEN AND PELVIS WITH CONTRAST TECHNIQUE: Multidetector CT imaging of the abdomen and  pelvis was performed using the standard protocol following bolus administration of intravenous contrast. CONTRAST:  116m OMNIPAQUE IOHEXOL 300 MG/ML  SOLN COMPARISON:  Abdominal radiograph dated 11/26/2020. FINDINGS: Lower chest: The visualized lung bases are clear. No intra-abdominal free air or free fluid. Hepatobiliary: Subcentimeter hypodense lesion in the left lobe of the liver is too small to characterize. The liver is otherwise unremarkable. No intrahepatic biliary dilatation. The gallbladder is unremarkable. Pancreas: Unremarkable. No pancreatic ductal dilatation or surrounding inflammatory changes. Spleen: Normal in size without focal abnormality. Adrenals/Urinary Tract: The adrenal glands are unremarkable. Nonobstructing left renal calculi measure up to 4 mm in the inferior pole of the left kidney. No hydronephrosis. The right kidney is unremarkable. The visualized ureters and urinary bladder appear unremarkable. Stomach/Bowel: There is sigmoid diverticulosis without active inflammatory changes. Loose stool noted within the colon compatible with diarrheal state. There is dilatation of proximal and mid small bowel loops measuring up to 4.3 cm. The distal small bowel loops are collapsed. There is gradual  tapering in the right lower quadrant likely representing an ileus. A small-bowel obstruction is not excluded. Small-bowel series study may provide better evaluation. The appendix is normal. Vascular/Lymphatic: Mild aortoiliac atherosclerotic disease. The IVC is unremarkable. No portal venous gas. There is no adenopathy. Reproductive: The prostate and seminal vesicles are grossly unremarkable. No pelvic mass. Other: None Musculoskeletal: No acute or significant osseous findings. IMPRESSION: 1. Ileus versus possible small-bowel obstruction with transition in the right lower quadrant. Small-bowel series study may provide better evaluation. 2. Sigmoid diverticulosis. 3. Nonobstructing left renal calculi. No  hydronephrosis. 4. Aortic Atherosclerosis (ICD10-I70.0). Electronically Signed   By: Anner Crete M.D.   On: 11/26/2020 03:18    Microbiology: Recent Results (from the past 240 hour(s))  Resp Panel by RT-PCR (Flu A&B, Covid) Nasopharyngeal Swab     Status: None   Collection Time: 11/26/20  4:39 AM   Specimen: Nasopharyngeal Swab; Nasopharyngeal(NP) swabs in vial transport medium  Result Value Ref Range Status   SARS Coronavirus 2 by RT PCR NEGATIVE NEGATIVE Final    Comment: (NOTE) SARS-CoV-2 target nucleic acids are NOT DETECTED.  The SARS-CoV-2 RNA is generally detectable in upper respiratory specimens during the acute phase of infection. The lowest concentration of SARS-CoV-2 viral copies this assay can detect is 138 copies/mL. A negative result does not preclude SARS-Cov-2 infection and should not be used as the sole basis for treatment or other patient management decisions. A negative result may occur with  improper specimen collection/handling, submission of specimen other than nasopharyngeal swab, presence of viral mutation(s) within the areas targeted by this assay, and inadequate number of viral copies(<138 copies/mL). A negative result must be combined with clinical observations, patient history, and epidemiological information. The expected result is Negative.  Fact Sheet for Patients:  EntrepreneurPulse.com.au  Fact Sheet for Healthcare Providers:  IncredibleEmployment.be  This test is no t yet approved or cleared by the [place] FDA and  has been authorized for detection and/or diagnosis of SARS-CoV-2 by FDA under an Emergency Use Authorization (EUA). This EUA will remain  in effect (meaning this test can be used) for the duration of the COVID-19 declaration under Section 564(b)(1) of the Act, 21 U.S.C.section 360bbb-3(b)(1), unless the authorization is terminated  or revoked sooner.       Influenza A by PCR NEGATIVE  NEGATIVE Final   Influenza B by PCR NEGATIVE NEGATIVE Final    Comment: (NOTE) The Xpert Xpress SARS-CoV-2/FLU/RSV plus assay is intended as an aid in the diagnosis of influenza from Nasopharyngeal swab specimens and should not be used as a sole basis for treatment. Nasal washings and aspirates are unacceptable for Xpert Xpress SARS-CoV-2/FLU/RSV testing.  Fact Sheet for Patients: EntrepreneurPulse.com.au  Fact Sheet for Healthcare Providers: IncredibleEmployment.be  This test is not yet approved or cleared by the [place] FDA and has been authorized for detection and/or diagnosis of SARS-CoV-2 by FDA under an Emergency Use Authorization (EUA). This EUA will remain in effect (meaning this test can be used) for the duration of the COVID-19 declaration under Section 564(b)(1) of the Act, 21 U.S.C. section 360bbb-3(b)(1), unless the authorization is terminated or revoked.  Performed at [facility], [place] [address] [place], [place] [address]      Labs: Basic Metabolic Panel: Recent Labs  Lab 11/25/20 2343 11/26/20 0357 11/26/20 0441  NA 139 141  --   K 3.8 3.8  --   CL 104 103  --   CO2 24 29  --   GLUCOSE  129* 120*  --   BUN 19 20  --   CREATININE 0.75 0.79  --   CALCIUM 9.5 9.3  --   MG  --   --  2.3  PHOS  --   --  3.3   Liver Function Tests: Recent Labs  Lab 11/25/20 2343  AST 23  ALT 28  ALKPHOS 110  BILITOT 1.3*  PROT 7.2  ALBUMIN 3.8   Recent Labs  Lab 11/25/20 2343  LIPASE 42   No results for input(s): AMMONIA in the last 168 hours. CBC: Recent Labs  Lab 11/25/20 2343 11/26/20 0357  WBC 11.4* 9.5  HGB 15.7 15.7  HCT 46.2 46.4  MCV 94.5 96.9  PLT 309 302   Cardiac Enzymes: No results for input(s): CKTOTAL, CKMB, CKMBINDEX, TROPONINI in the last 168 hours. BNP: BNP (last 3 results) No results for input(s): BNP in the last 8760 hours.  ProBNP (last 3 results) No results for input(s):  PROBNP in the last 8760 hours.  CBG: No results for input(s): GLUCAP in the last 168 hours.     Signed:  Nita Sells MD   [facility] 11/26/2020, 10:46 AM

## 2020-11-26 NOTE — ED Notes (Signed)
Provider at bedside at this time

## 2020-11-26 NOTE — ED Notes (Signed)
Not in the room at this time 

## 2020-11-28 ENCOUNTER — Ambulatory Visit (INDEPENDENT_AMBULATORY_CARE_PROVIDER_SITE_OTHER): Payer: 59 | Admitting: Internal Medicine

## 2020-11-28 ENCOUNTER — Encounter: Payer: Self-pay | Admitting: Internal Medicine

## 2020-11-28 ENCOUNTER — Other Ambulatory Visit: Payer: Self-pay

## 2020-11-28 ENCOUNTER — Telehealth: Payer: Self-pay | Admitting: Internal Medicine

## 2020-11-28 VITALS — BP 132/82 | HR 64 | Temp 99.1°F | Ht 72.01 in | Wt 315.8 lb

## 2020-11-28 DIAGNOSIS — K567 Ileus, unspecified: Secondary | ICD-10-CM

## 2020-11-28 MED ORDER — LIDOCAINE 5 % EX PTCH: 1.0000 | MEDICATED_PATCH | CUTANEOUS | 0 refills | Status: DC

## 2020-11-28 NOTE — Telephone Encounter (Signed)
Patient wife- Joven Mom, and wants to know why you stopped the Ozempic and Lyrica. Wanted to know if it would have been better to stop one or the other to see what medication was the cause. Patient also states that they both would like for him to stay on the Ozempic at a lower dose, which he told his wife you told him that they dont make. Please advise.

## 2020-11-28 NOTE — Telephone Encounter (Signed)
Pt wife - Oziah Vitanza ( wife)- is calling to speak to the nurse regarding pts office visit today that the patient was taken off of ozempic & Lyrica. Please advice - [phone]

## 2020-11-28 NOTE — Patient Instructions (Signed)
Ileus  Ileus is a condition that happens when the intestines, which are also called bowels, stop working correctly. The intestines are hollow organs that digest food after the food leaves the stomach. These organs are long, muscular tubes that connect the stomach to the rectum. When ileus occurs, the muscular contractions that cause food to move through the intestines do not happen asthey normally would. If the intestines stop working, food cannot pass through to get digested. This condition is a serious problem that usually requires hospitalization. It can cause symptoms such as nausea, abdominal pain, and bloating. Ileus can lastfrom a few hours to a few days. What are the causes? This condition may be caused by: Surgery on the abdomen. An infection or inflammation in the abdomen. This includes inflammation of the lining of the abdomen (peritonitis). Infection or inflammation in other parts of the body, such as pneumonia or pancreatitis. Passage of gallstones or kidney stones. Damage to the nerves or blood vessels that go to the intestines. A collection of blood within the abdominal cavity. Imbalance in the salts in the blood (electrolytes). Injury to the brain or spinal cord. Medicines. Many medicines, including strong pain medicines, can cause ileus or make it worse. If the intestines stop working because of a blockage, that is a differentcondition that is called a bowel obstruction. What are the signs or symptoms? Symptoms of this condition include: Bloating of the abdomen. Pain or discomfort in the abdomen. Poor appetite. Nausea and vomiting. Lack of normal bowel sounds, such as "growling" in the stomach. How is this diagnosed? This condition may be diagnosed with: A physical exam and medical history. X-rays or a CT scan of the abdomen. You may also have other tests to help find the cause of the condition. How is this treated? This condition may be treated by: Resting the  intestines until they start to work again. This is often done by: Stopping oral intake of food and drink. You will be given fluid through an IV to prevent dehydration. Placing a small tube (nasogastric tube or NG tube) that is passed through your nose and into your stomach. The tube is attached to a suction device and keeps the stomach emptied out. This allows the bowels to rest and helps to reduce nausea and vomiting. Correcting any electrolyte imbalance by giving supplements in the IV fluid. Stopping any medicines that might make ileus worse. Treating any condition that may have caused ileus. Follow these instructions at home: Eating and drinking  Follow instructions from your health care provider about: What to eat and drink. You may be told to start eating a bland diet. Over time, you may slowly resume a more normal, healthy diet. How much to eat and drink. You should eat small meals often and stop eating when you feel full. Avoid alcohol.  General instructions Take over-the-counter and prescription medicines only as told by your health care provider. Rest as told by your health care provider. Avoid sitting for a long time without moving. Get up to take short walks every 1-2 hours. Ask for help if you feel weak or unsteady. Keep all follow-up visits as told by your health care provider. This is important. Contact a health care provider if: You have nausea, vomiting, or abdominal discomfort. You have a fever. Get help right away if: You have severe abdominal pain or bloating. You cannot eat or drink without vomiting. Summary Ileus is a condition that happens when the intestines, which are also called bowels, stop working  correctly. When ileus occurs, the muscular contractions that cause food to move through the intestines do not happen as they normally would. Ileus can cause symptoms such as nausea, abdominal pain, and bloating. Treatment may involve getting IV fluids and having a  nasogastric tube placed to keep your stomach emptied out until the intestines start working again. This information is not intended to replace advice given to you by your health care provider. Make sure you discuss any questions you have with your healthcare provider. Document Revised: 09/02/2017 Document Reviewed: 09/02/2017 Elsevier Patient Education  2022 [employer].

## 2020-11-28 NOTE — Progress Notes (Signed)
BP 132/82   Pulse 64   Temp 99.1 F (37.3 C) (Oral)   Ht 6' 0.01" (1.829 m)   Wt (!) 315 lb 12.8 oz (143.2 kg)   SpO2 97%   BMI 42.82 kg/m    Subjective:    Patient ID: Christopher Hull, male    DOB: [birth year], 55 y.o.   MRN: [id number]  Chief Complaint  Patient presents with   Hospitalization Follow-up    Was in ER over Friday Saturday for bloated stomach ,ER dx possible Ileus, recommend CBC and LFT at f/u. Was told by ER to stop Ozempic until he sees PCP. Was given Mylicon tablets which seems to help the bloating feeling    HPI: Christopher Hull is a 55 y.o. male  Pt was in the hospital for abdominal bloating says he felt bad and went to the hospital he diagnosed with ileus denies nausea vomiting continue to feel like his pain got worse after eating meals and went to the ER.  Patient was diagnosed with SBO/ileus and was advised bowel rest.  He was evaluated by a surgeon while he was there who did not feel it was necessary for him to have surgery at that point. Admit date: 11/25/2020 Discharge date: 11/26/2020\ Patient claims that he feels better now no more bloating no abdominal pain no nausea vomiting or diarrhea has had regular bowels no constipation has held Ozempic since it was advised in the ER to do so.  That he had liked it for his weight loss as he did noticed that it was curbing his appetite and he seems to not eat as much while on it  Abdominal Pain This is a new problem. The problem has been gradually improving. The pain is at a severity of 4/10. The pain is mild. Pertinent negatives include no anorexia, arthralgias, belching, constipation, diarrhea, dysuria, fever, flatus, frequency, headaches, hematochezia, hematuria, melena, myalgias, nausea, vomiting or weight loss.   Chief Complaint  Patient presents with   Hospitalization Follow-up    Was in ER over Friday Saturday for bloated stomach ,ER dx possible Ileus, recommend CBC and LFT at f/u. Was told by ER to stop  Ozempic until he sees PCP. Was given Mylicon tablets which seems to help the bloating feeling    Relevant past medical, surgical, family and social history reviewed and updated as indicated. Interim medical history since our last visit reviewed. Allergies and medications reviewed and updated.  Review of Systems  Constitutional:  Negative for fever and weight loss.  Gastrointestinal:  Positive for abdominal pain. Negative for anorexia, constipation, diarrhea, flatus, hematochezia, melena, nausea and vomiting.  Genitourinary:  Negative for dysuria, frequency and hematuria.  Musculoskeletal:  Negative for arthralgias and myalgias.  Neurological:  Negative for headaches.   Per HPI unless specifically indicated above     Objective:    BP 132/82   Pulse 64   Temp 99.1 F (37.3 C) (Oral)   Ht 6' 0.01" (1.829 m)   Wt (!) 315 lb 12.8 oz (143.2 kg)   SpO2 97%   BMI 42.82 kg/m   Wt Readings from Last 3 Encounters:  11/28/20 (!) 315 lb 12.8 oz (143.2 kg)  11/25/20 (!) 324 lb (147 kg)  11/14/20 (!) 324 lb 6.4 oz (147.1 kg)    Physical Exam Vitals and nursing note reviewed.  Constitutional:      General: He is not in acute distress.    Appearance: Normal appearance. He is not ill-appearing or diaphoretic.  HENT:     Head: Normocephalic and atraumatic.     Right Ear: Tympanic membrane and external ear normal. There is no impacted cerumen.     Left Ear: External ear normal.     Nose: No congestion or rhinorrhea.     Mouth/Throat:     Pharynx: No oropharyngeal exudate or posterior oropharyngeal erythema.  Eyes:     Conjunctiva/sclera: Conjunctivae normal.     Pupils: Pupils are equal, round, and reactive to light.  Cardiovascular:     Rate and Rhythm: Normal rate and regular rhythm.     Heart sounds: No murmur heard.   No friction rub. No gallop.  Pulmonary:     Effort: No respiratory distress.     Breath sounds: No stridor. No wheezing or rhonchi.  Chest:     Chest wall: No  tenderness.  Abdominal:     General: Abdomen is flat. Bowel sounds are normal. There is no distension.     Palpations: Abdomen is soft. There is no mass.     Tenderness: There is no abdominal tenderness.     Comments: Obese no distention abdominal thrills or fluid noted on palpation or percussion  Musculoskeletal:     Cervical back: Normal range of motion and neck supple. No rigidity or tenderness.     Left lower leg: No edema.  Skin:    General: Skin is warm and dry.  Neurological:     Mental Status: He is alert.    Results for orders placed or performed during the hospital encounter of 11/25/20  Resp Panel by RT-PCR (Flu A&B, Covid) Nasopharyngeal Swab   Specimen: Nasopharyngeal Swab; Nasopharyngeal(NP) swabs in vial transport medium  Result Value Ref Range   SARS Coronavirus 2 by RT PCR NEGATIVE NEGATIVE   Influenza A by PCR NEGATIVE NEGATIVE   Influenza B by PCR NEGATIVE NEGATIVE  Lipase, blood  Result Value Ref Range   Lipase 42 11 - 51 U/L  Comprehensive metabolic panel  Result Value Ref Range   Sodium 139 135 - 145 mmol/L   Potassium 3.8 3.5 - 5.1 mmol/L   Chloride 104 98 - 111 mmol/L   CO2 24 22 - 32 mmol/L   Glucose, Bld 129 (H) 70 - 99 mg/dL   BUN 19 6 - 20 mg/dL   Creatinine, Ser 1.610.75 0.61 - 1.24 mg/dL   Calcium 9.5 8.9 - 09.610.3 mg/dL   Total Protein 7.2 6.5 - 8.1 g/dL   Albumin 3.8 3.5 - 5.0 g/dL   AST 23 15 - 41 U/L   ALT 28 0 - 44 U/L   Alkaline Phosphatase 110 38 - 126 U/L   Total Bilirubin 1.3 (H) 0.3 - 1.2 mg/dL   GFR, Estimated >04>60 >54>60 mL/min   Anion gap 11 5 - 15  CBC  Result Value Ref Range   WBC 11.4 (H) 4.0 - 10.5 K/uL   RBC 4.89 4.22 - 5.81 MIL/uL   Hemoglobin 15.7 13.0 - 17.0 g/dL   HCT 09.846.2 11.939.0 - 14.752.0 %   MCV 94.5 80.0 - 100.0 fL   MCH 32.1 26.0 - 34.0 pg   MCHC 34.0 30.0 - 36.0 g/dL   RDW 82.912.4 56.211.5 - 13.015.5 %   Platelets 309 150 - 400 K/uL   nRBC 0.0 0.0 - 0.2 %  Urinalysis, Routine w reflex microscopic Urine, Clean Catch  Result Value  Ref Range   Color, Urine AMBER (A) YELLOW   APPearance HAZY (A) CLEAR   Specific Gravity, Urine  1.033 (H) 1.005 - 1.030   pH 5.0 5.0 - 8.0   Glucose, UA NEGATIVE NEGATIVE mg/dL   Hgb urine dipstick NEGATIVE NEGATIVE   Bilirubin Urine NEGATIVE NEGATIVE   Ketones, ur NEGATIVE NEGATIVE mg/dL   Protein, ur NEGATIVE NEGATIVE mg/dL   Nitrite NEGATIVE NEGATIVE   Leukocytes,Ua NEGATIVE NEGATIVE  HIV Antibody (routine testing w rflx)  Result Value Ref Range   HIV Screen 4th Generation wRfx Non Reactive Non Reactive  CBC  Result Value Ref Range   WBC 9.5 4.0 - 10.5 K/uL   RBC 4.79 4.22 - 5.81 MIL/uL   Hemoglobin 15.7 13.0 - 17.0 g/dL   HCT 54.6 27.0 - 35.0 %   MCV 96.9 80.0 - 100.0 fL   MCH 32.8 26.0 - 34.0 pg   MCHC 33.8 30.0 - 36.0 g/dL   RDW 09.3 81.8 - 29.9 %   Platelets 302 150 - 400 K/uL   nRBC 0.0 0.0 - 0.2 %  Basic metabolic panel  Result Value Ref Range   Sodium 141 135 - 145 mmol/L   Potassium 3.8 3.5 - 5.1 mmol/L   Chloride 103 98 - 111 mmol/L   CO2 29 22 - 32 mmol/L   Glucose, Bld 120 (H) 70 - 99 mg/dL   BUN 20 6 - 20 mg/dL   Creatinine, Ser 3.71 0.61 - 1.24 mg/dL   Calcium 9.3 8.9 - 69.6 mg/dL   GFR, Estimated >78 >93 mL/min   Anion gap 9 5 - 15  Magnesium  Result Value Ref Range   Magnesium 2.3 1.7 - 2.4 mg/dL  Phosphorus  Result Value Ref Range   Phosphorus 3.3 2.5 - 4.6 mg/dL        Current Outpatient Medications:    acetaminophen (TYLENOL) 500 MG tablet, Take 1,000 mg by mouth every 6 (six) hours as needed for moderate pain or headache., Disp: , Rfl:    amLODipine (NORVASC) 10 MG tablet, Take 1 tablet (10 mg total) by mouth daily. Needs appt for further refills, Disp: 90 tablet, Rfl: 4   atorvastatin (LIPITOR) 40 MG tablet, Take 1 tablet (40 mg total) by mouth daily. Needs appt for further refills, Disp: 90 tablet, Rfl: 4   losartan (COZAAR) 100 MG tablet, Take 1 tablet (100 mg total) by mouth daily., Disp: , Rfl:    omeprazole (PRILOSEC) 20 MG capsule,  Take 1 capsule (20 mg total) by mouth daily., Disp: 90 capsule, Rfl: 4   pramipexole (MIRAPEX) 1 MG tablet, Take 1 tablet (1 mg total) by mouth 2 (two) times daily., Disp: 120 tablet, Rfl: 5   pregabalin (LYRICA) 100 MG capsule, Take 1 capsule (100 mg total) by mouth every evening., Disp: 30 capsule, Rfl: 3   simethicone (MYLICON) 80 MG chewable tablet, Chew 1 tablet (80 mg total) by mouth 4 (four) times daily., Disp: 30 tablet, Rfl: 0   triamcinolone ointment (KENALOG) 0.1 %, Apply 1 application topically 2 (two) times daily., Disp: 30 g, Rfl: 0    Assessment & Plan:  Ct abdomen / pelvis :  1. Ileus versus possible small-bowel obstruction with transition in the right lower quadrant. Small-bowel series study may provide better evaluation. 2. Sigmoid diverticulosis. 3. Nonobstructing left renal calculi. No hydronephrosis. 4. Aortic Atherosclerosis   Ileus : ? Sec to lyrica vs ozempic.  We will hold Lyrica as well.  Patient was advised not to restart Ozempic for now from the ER. Was also started in the last few weeks and on reviewing does have a  side effect of small bowel obstruction/ileus Unsure Lifestyle modifications advised to pt.  Portion control and avoiding high carb low fat diet advised.  Diet plan given to pt   exercise plan given and encouraged.  To increase exercise to 150 mins a week ie 21/2 hours a week. Pt verbalises understanding of the above.   Set up patient to follow-up with GI for further management labs ordered including a CBC and hep panel  Follow up plan: No follow-ups on file.

## 2020-11-29 ENCOUNTER — Telehealth: Payer: Self-pay

## 2020-11-29 ENCOUNTER — Encounter: Payer: Self-pay | Admitting: *Deleted

## 2020-11-29 NOTE — Telephone Encounter (Signed)
PA started through Cover meds for Lidocaine 5% patches . Awaiting on determination

## 2020-11-29 NOTE — Telephone Encounter (Signed)
Cannot restart yet sec to new diagnosis of ILEUS will need to get to see Gi and get an ok from them before restarting. Pl let pts wife know thnx. Had d/w  at his visit.

## 2020-11-29 NOTE — Telephone Encounter (Signed)
LVM will call back later.

## 2020-11-30 NOTE — Telephone Encounter (Signed)
Spoke with Patients wife and explained that due to the side effects that may be happening with her husband, it is best and the safest way is to ask an GI doctor if it is ok to restart the Oxempic due to his dx of an ILEUS. Wife verbalized understanding.

## 2020-12-05 ENCOUNTER — Other Ambulatory Visit: Payer: Self-pay

## 2020-12-05 ENCOUNTER — Other Ambulatory Visit: Payer: 59

## 2020-12-05 DIAGNOSIS — K567 Ileus, unspecified: Secondary | ICD-10-CM

## 2020-12-05 DIAGNOSIS — I10 Essential (primary) hypertension: Secondary | ICD-10-CM

## 2020-12-06 LAB — CBC WITH DIFFERENTIAL/PLATELET
Basophils Absolute: 0 10*3/uL (ref 0.0–0.2)
Basos: 1 %
EOS (ABSOLUTE): 0.1 10*3/uL (ref 0.0–0.4)
Eos: 2 %
Hematocrit: 44.2 % (ref 37.5–51.0)
Hemoglobin: 14.9 g/dL (ref 13.0–17.7)
Immature Grans (Abs): 0 10*3/uL (ref 0.0–0.1)
Immature Granulocytes: 1 %
Lymphocytes Absolute: 2 10*3/uL (ref 0.7–3.1)
Lymphs: 31 %
MCH: 31.5 pg (ref 26.6–33.0)
MCHC: 33.7 g/dL (ref 31.5–35.7)
MCV: 93 fL (ref 79–97)
Monocytes Absolute: 0.6 10*3/uL (ref 0.1–0.9)
Monocytes: 9 %
Neutrophils Absolute: 3.6 10*3/uL (ref 1.4–7.0)
Neutrophils: 56 %
Platelets: 289 10*3/uL (ref 150–450)
RBC: 4.73 x10E6/uL (ref 4.14–5.80)
RDW: 12.5 % (ref 11.6–15.4)
WBC: 6.4 10*3/uL (ref 3.4–10.8)

## 2020-12-06 LAB — CMP14+EGFR
ALT: 22 IU/L (ref 0–44)
AST: 15 IU/L (ref 0–40)
Albumin/Globulin Ratio: 1.6 (ref 1.2–2.2)
Albumin: 4.4 g/dL (ref 3.8–4.9)
Alkaline Phosphatase: 124 IU/L — ABNORMAL HIGH (ref 44–121)
BUN/Creatinine Ratio: 22 — ABNORMAL HIGH (ref 9–20)
BUN: 17 mg/dL (ref 6–24)
Bilirubin Total: 0.4 mg/dL (ref 0.0–1.2)
CO2: 22 mmol/L (ref 20–29)
Calcium: 9.1 mg/dL (ref 8.7–10.2)
Chloride: 106 mmol/L (ref 96–106)
Creatinine, Ser: 0.76 mg/dL (ref 0.76–1.27)
Globulin, Total: 2.7 g/dL (ref 1.5–4.5)
Glucose: 93 mg/dL (ref 65–99)
Potassium: 4.4 mmol/L (ref 3.5–5.2)
Sodium: 143 mmol/L (ref 134–144)
Total Protein: 7.1 g/dL (ref 6.0–8.5)
eGFR: 106 mL/min/{1.73_m2} (ref >59)

## 2020-12-06 NOTE — Telephone Encounter (Signed)
Pts wife called in stating the medication was sent over for the wrong reason and got denied, it was supposed to be sent over for the shinlges, and the pharmacy needs it resent.   lidocaine (LIDODERM) 5 %  [facility] [employer] - [place], [place] - [address]  [address] [place] [address]  Phone:  [phone]  Fax:  [phone]

## 2020-12-06 NOTE — Telephone Encounter (Signed)
PA was resent to state that patches are for Shingles.

## 2020-12-07 ENCOUNTER — Telehealth: Payer: Self-pay

## 2020-12-07 ENCOUNTER — Ambulatory Visit (INDEPENDENT_AMBULATORY_CARE_PROVIDER_SITE_OTHER): Payer: 59 | Admitting: Gastroenterology

## 2020-12-07 ENCOUNTER — Other Ambulatory Visit: Payer: Self-pay

## 2020-12-07 VITALS — BP 153/91 | HR 65 | Temp 98.1°F | Ht 72.0 in | Wt 316.0 lb

## 2020-12-07 DIAGNOSIS — R748 Abnormal levels of other serum enzymes: Secondary | ICD-10-CM | POA: Diagnosis not present

## 2020-12-07 DIAGNOSIS — K567 Ileus, unspecified: Secondary | ICD-10-CM

## 2020-12-07 NOTE — Telephone Encounter (Signed)
Called and spoke with wife, she stated that her husband was just seen in GI and stated that it was probably the Ozempic and not lyrics so he stared taking it for the pain from his shingles.  Informed her that patient should try using OTC pain patches for the pain until they are certain that it was either the Lyrica or Ozempic that caused an ILEUS. Wife verbalized understanding. Will call back when PA for Lidocaine patches is approved or disapproved by ins.

## 2020-12-07 NOTE — Telephone Encounter (Signed)
Copied from CRM [id number]. Topic: General - Other >> Dec 07, 2020 12:00 PM Marylen Ponto wrote: Reason for CRM: Pt wife stated they received a letter that the Rx for lidocaine patch was denied for diverticulitis when the Rx should have been shingles. Pt wife also stated that pt has been taking Lyrica because he does not have anything else for the pain. Pt wife requests call back at [phone]

## 2020-12-07 NOTE — Progress Notes (Signed)
Christopher Hull, [place] [address]  Main: [phone]  Fax: [phone]   Gastroenterology Consultation  Referring Provider:     Charlynne Cousins, MD Primary Care Physician:  Charlynne Cousins, MD Reason for Consultation:     Ileus        HPI:    Chief Complaint  Patient presents with   New Patient (Initial Visit)    Pt denies any family Hx of cancer etc... Never had colonscopy   Abdominal Pain    Describes as tightness and bloating... pt reports daily BM... Denies blood in stools... denies N/V...   Hospitalization Follow-up    CT IMPRESSION: 1. Ileus versus possible small-bowel obstruction with transition in the right lower quadrant. Small-bowel series study may provide better evaluation. 2. Sigmoid diverticulosis.    Christopher Hull is a 55 y.o. y/o male referred for consultation & management  by Dr. Charlynne Cousins, MD. patient was recently hospitalized with abdominal distention, nausea and vomiting and imaging concerning for ileus versus possible small bowel obstruction on presentation.  Patient stated that upon visit to the ER, his symptoms started to get better within hours, and within a day symptoms were significantly better and since hospital discharge, he has not had any further symptoms and he is back to baseline.  Denies any further nausea vomiting, abdominal distention or bloating.  Reports good appetite.  ER provider notes, H&P, discharge summary, and surgery consult from July 2022 admission reviewed and symptoms were attribute it to possible new medication changes including patient's new Ozempic that was started as an outpatient.  Ozempic is being held at this time.  Surgery did not recommend any further intervention with resolution in symptoms and did not recommend any further follow-up as patient was having flatus, bowel movements and air was seen in the colon on imaging.  Patient denies any previous history of similar  symptoms  Denies any previous EGD or colonoscopy.  No family history of colon cancer.  Past Medical History:  Diagnosis Date   Essential hypertension    Hyperlipidemia    Morbid obesity ([place])    Sleep apnea     No past surgical history on file.  Prior to Admission medications   Medication Sig Start Date End Date Taking? Authorizing Provider  acetaminophen (TYLENOL) 500 MG tablet Take 1,000 mg by mouth every 6 (six) hours as needed for moderate pain or headache.   Yes [provider]  amLODipine (NORVASC) 10 MG tablet Take 1 tablet (10 mg total) by mouth daily. Needs appt for further refills 12/10/19  Yes Cannady, Jolene T, NP  atorvastatin (LIPITOR) 40 MG tablet Take 1 tablet (40 mg total) by mouth daily. Needs appt for further refills 12/10/19  Yes Cannady, Jolene T, NP  losartan (COZAAR) 100 MG tablet Take 1 tablet (100 mg total) by mouth daily. 11/26/20  Yes Nita Sells, MD  omeprazole (PRILOSEC) 20 MG capsule Take 1 capsule (20 mg total) by mouth daily. 12/10/19  Yes Cannady, Jolene T, NP  pramipexole (MIRAPEX) 1 MG tablet Take 1 tablet (1 mg total) by mouth 2 (two) times daily. 09/05/20  Yes Dahlia Byes, PA-C    Family History  Problem Relation Age of Onset   Hypertension Mother    Dementia Mother    Hypertension Father    Diabetes Maternal Uncle      Social History   Tobacco Use   Smoking status: Never   Smokeless tobacco: Never  Vaping  Use   Vaping Use: Never used  Substance Use Topics   Alcohol use: Yes    Comment: rare   Drug use: No    Allergies as of 12/07/2020 - Review Complete 12/07/2020  Allergen Reaction Noted   Penicillins  04/06/2015    Review of Systems:    All systems reviewed and negative except where noted in HPI.   Physical Exam:  Constitutional: General:   Alert,  Well-developed, well-nourished, pleasant and cooperative in NAD BP (!) 153/91   Pulse 65   Temp 98.1 F (36.7 C) (Oral)   Ht 6' (1.829 m)   Wt (!) 316 lb  (143.3 kg)   BMI 42.86 kg/m   Eyes:  Sclera clear, no icterus.   Conjunctiva pink. PERRLA  Ears:  No scars, lesions or masses, Normal auditory acuity. Nose:  No deformity, discharge, or lesions. Mouth:  No deformity or lesions, oropharynx pink & moist.  Neck:  Supple; no masses or thyromegaly.  Respiratory: Normal respiratory effort, Normal percussion  Gastrointestinal:  No bruits.  Soft, non-tender and non-distended without masses, hepatosplenomegaly or hernias noted.  No guarding or rebound tenderness.     Cardiac: No clubbing or edema.  No cyanosis. Normal posterior tibial pedal pulses noted.  Lymphatic:  No significant cervical or axillary adenopathy.  Psych:  Alert and cooperative. Normal mood and affect.  Musculoskeletal:  Normal gait. Head normocephalic, atraumatic. Symmetrical without gross deformities. 5/5 Upper and Lower extremity strength bilaterally.  Skin: Warm. Intact without significant lesions or rashes. No jaundice.  Neurologic:  Face symmetrical, tongue midline, Normal sensation to touch;  grossly normal neurologically.  Psych:  Alert and oriented x3, Alert and cooperative. Normal mood and affect.   Labs: CBC    Component Value Date/Time   WBC 6.4 12/05/2020 1127   WBC 9.5 11/26/2020 0357   RBC 4.73 12/05/2020 1127   RBC 4.79 11/26/2020 0357   HGB 14.9 12/05/2020 1127   HCT 44.2 12/05/2020 1127   PLT 289 12/05/2020 1127   MCV 93 12/05/2020 1127   MCH 31.5 12/05/2020 1127   MCH 32.8 11/26/2020 0357   MCHC 33.7 12/05/2020 1127   MCHC 33.8 11/26/2020 0357   RDW 12.5 12/05/2020 1127   LYMPHSABS 2.0 12/05/2020 1127   EOSABS 0.1 12/05/2020 1127   BASOSABS 0.0 12/05/2020 1127   CMP     Component Value Date/Time   NA 143 12/05/2020 1127   K 4.4 12/05/2020 1127   CL 106 12/05/2020 1127   CO2 22 12/05/2020 1127   GLUCOSE 93 12/05/2020 1127   GLUCOSE 120 (H) 11/26/2020 0357   BUN 17 12/05/2020 1127   CREATININE 0.76 12/05/2020 1127   CALCIUM 9.1  12/05/2020 1127   PROT 7.1 12/05/2020 1127   ALBUMIN 4.4 12/05/2020 1127   AST 15 12/05/2020 1127   ALT 22 12/05/2020 1127   ALKPHOS 124 (H) 12/05/2020 1127   BILITOT 0.4 12/05/2020 1127   GFRNONAA >60 11/26/2020 0357   GFRAA 128 12/10/2019 1015    Imaging Studies: Abdominal x-[place] July 2022  FINDINGS: Multiple dilated and air-filled loops of small bowel measure up to 5.3 cm. Air is noted in the proximal transverse colon. No free air or radiopaque calculi identified. The osseous structures and soft tissues are unremarkable.   IMPRESSION: Small bowel obstruction versus ileus.  CT abdomen pelvis  IMPRESSION: 1. Ileus versus possible small-bowel obstruction with transition in the right lower quadrant. Small-bowel series study may provide better evaluation. 2. Sigmoid diverticulosis. 3. Nonobstructing left  renal calculi. No hydronephrosis. 4. Aortic Atherosclerosis (ICD10-I70.0).  Assessment and Plan:   Christopher Hull is a 55 y.o. y/o male has been referred for follow-up posthospitalization for ileus seen on abdominal x-[place] and CT scan as above  Patient has been completely asymptomatic since hospital discharge and her review of the notes reveal that symptoms were attributed to possible medication changes including Ozempic that was started recently.  Review of the literature shows that Ozempic does have over 10% chance of gastrointestinal adverse effects including abdominal pain, nausea, vomiting and a 1 to 10% chance of abdominal distention.  Patient was also given Valtrex for shingles earlier in July 2022 as well and that also has adverse effects of abdominal pain and nausea listed in the literature.  Symptoms have completely resolved and may have been medication induced given the acuity of symptoms and acute resolution after discontinuing medications and conservative management with IV fluids during the hospital stay  No obstructive lesion seen on imaging which is reassuring  and surgery did not recommend any further work-up or interventions or need for follow-up   Patient has never had a screening colonoscopy and I did discuss this with him.  We would possibly be able to intubate the terminal ileum during the exam as well.  Would recommend screening colonoscopy in at least 6 to 8 weeks from now.  However, after discussing risk benefits in detail, patient does not want to schedule at this time and states that he will think about it and when and if he is ready, he will call us back.  Given patient completely asymptomatic, and otherwise reassuring labs with no anemia on CBC, no further indication for repeat imaging at this time either   However, his CT scan did report a subcentimeter hypodense lesion in the left lobe of the liver which is too small to characterize.  Otherwise liver was unremarkable.  Would recommend right upper quadrant ultrasound to evaluate this further in about 3 months.  This was discussed with patient and can be scheduled on his next visit   A review of his liver enzymes showed that he has had mildly elevated alk phos on most recent labs as above, and 2 years ago alk phos was elevated to 142.  Will obtain GGT to evaluate if this is coming from the liver.  Other liver enzymes were otherwise normal.    Dr Christopher Antigua  Speech recognition software was used to dictate the above note.

## 2020-12-08 LAB — GAMMA GT: GGT: 28 IU/L (ref 0–65)

## 2020-12-09 ENCOUNTER — Telehealth: Payer: Self-pay

## 2020-12-09 NOTE — Telephone Encounter (Signed)
PA for Lidocaine patches has been denied.

## 2020-12-12 ENCOUNTER — Ambulatory Visit: Payer: 59 | Admitting: Internal Medicine

## 2020-12-20 ENCOUNTER — Other Ambulatory Visit: Payer: Self-pay | Admitting: Nurse Practitioner

## 2020-12-20 ENCOUNTER — Encounter: Payer: Self-pay | Admitting: Internal Medicine

## 2020-12-20 DIAGNOSIS — I10 Essential (primary) hypertension: Secondary | ICD-10-CM

## 2020-12-20 NOTE — Telephone Encounter (Signed)
Called and LVM asking for patient to please return my call. Need to find out if he has enough medication to get to his upcoming appointment.

## 2020-12-20 NOTE — Telephone Encounter (Signed)
Requested medication (s) are due for refill today:   Not sure   prescribed from an ED visit  Requested medication (s) are on the active medication list:   Yes  Future visit scheduled:   Yes in 1 wk with Dr. Charlotta Newton   Last ordered: 11/26/2020 from an ED visit, ED provider   Requested Prescriptions  Pending Prescriptions Disp Refills   losartan (COZAAR) 100 MG tablet [Pharmacy Med Name: LOSARTAN POTASSIUM 100 MG TAB] 90 tablet 0    Sig: TAKE 1 TABLET BY MOUTH ONCE DAILY.      Cardiovascular:  Angiotensin Receptor Blockers Failed - 12/20/2020  8:11 AM      Failed - Last BP in normal range    BP Readings from Last 1 Encounters:  12/07/20 (!) 153/91          Passed - Cr in normal range and within 180 days    Creatinine, Ser  Date Value Ref Range Status  12/05/2020 0.76 0.76 - 1.27 mg/dL Final          Passed - K in normal range and within 180 days    Potassium  Date Value Ref Range Status  12/05/2020 4.4 3.5 - 5.2 mmol/L Final          Passed - Patient is not pregnant      Passed - Valid encounter within last 6 months    Recent Outpatient Visits           3 weeks ago Ileus (HCC)   [facility] Vigg, Avanti, MD   1 month ago Herpes zoster with complication   [facility] Vigg, Avanti, MD   1 month ago Herpes zoster with complication   [facility] Vigg, Avanti, MD   1 year ago Morbid obesity (HCC)   [facility] [place], [place] T, NP   2 years ago Essential hypertension   [facility] [place], Redge Gainer, MD       Future Appointments             In 1 week Vigg, Avanti, MD [facility]   In 2 months Pasty Spillers, MD [facility] [place]

## 2020-12-29 ENCOUNTER — Other Ambulatory Visit: Payer: Self-pay

## 2020-12-29 ENCOUNTER — Encounter: Payer: Self-pay | Admitting: Internal Medicine

## 2020-12-29 ENCOUNTER — Ambulatory Visit (INDEPENDENT_AMBULATORY_CARE_PROVIDER_SITE_OTHER): Payer: 59 | Admitting: Internal Medicine

## 2020-12-29 DIAGNOSIS — I10 Essential (primary) hypertension: Secondary | ICD-10-CM | POA: Diagnosis not present

## 2020-12-29 DIAGNOSIS — E785 Hyperlipidemia, unspecified: Secondary | ICD-10-CM

## 2020-12-29 DIAGNOSIS — G4733 Obstructive sleep apnea (adult) (pediatric): Secondary | ICD-10-CM | POA: Diagnosis not present

## 2020-12-29 NOTE — Patient Instructions (Signed)

## 2020-12-29 NOTE — Progress Notes (Signed)
BP (!) 147/88   Pulse 66   Temp 98.6 F (37 C) (Oral)   Ht 6' 0.05" (1.83 m)   Wt (!) 319 lb 6.4 oz (144.9 kg)   SpO2 97%   BMI 43.26 kg/m    Subjective:    Patient ID: Christopher Hull, male    DOB: November 14, 1965, 55 y.o.   MRN: [id number]  Chief Complaint  Patient presents with   Diabetes   Ileus    Patient states that he is feeling much better     HPI: Christopher Hull is a 55 y.o. male  PT IS here for a fu, he was seen at [facility] for ileus , was deemed sec to ozempic.    Hyperlipidemia This is a chronic problem. The problem is controlled. Exacerbating diseases include obesity. He has no history of chronic renal disease, diabetes, hypothyroidism or liver disease. Pertinent negatives include no chest pain, myalgias or shortness of breath.   Chief Complaint  Patient presents with   Diabetes   Ileus    Patient states that he is feeling much better     Relevant past medical, surgical, family and social history reviewed and updated as indicated. Interim medical history since our last visit reviewed. Allergies and medications reviewed and updated.  Review of Systems  Constitutional:  Negative for activity change, appetite change, chills, fatigue and fever.  HENT:  Negative for congestion, ear discharge, ear pain and facial swelling.   Eyes:  Negative for pain, discharge, itching and visual disturbance.  Respiratory:  Negative for apnea, cough, chest tightness, shortness of breath and wheezing.   Cardiovascular:  Negative for chest pain, palpitations and leg swelling.  Gastrointestinal:  Negative for abdominal distention, abdominal pain, blood in stool, constipation, diarrhea, nausea and vomiting.  Endocrine: Negative for cold intolerance, heat intolerance, polydipsia, polyphagia and polyuria.  Genitourinary:  Negative for difficulty urinating, dysuria, flank pain, frequency, hematuria and urgency.  Musculoskeletal:  Negative for arthralgias, gait problem, joint swelling  and myalgias.  Skin:  Negative for color change, rash and wound.  Neurological:  Negative for dizziness, tremors, speech difficulty, weakness, light-headedness, numbness and headaches.  Hematological:  Does not bruise/bleed easily.  Psychiatric/Behavioral:  Negative for behavioral problems, confusion, decreased concentration, sleep disturbance and suicidal ideas. The patient is not nervous/anxious.    Per HPI unless specifically indicated above     Objective:    BP (!) 147/88   Pulse 66   Temp 98.6 F (37 C) (Oral)   Ht 6' 0.05" (1.83 m)   Wt (!) 319 lb 6.4 oz (144.9 kg)   SpO2 97%   BMI 43.26 kg/m   Wt Readings from Last 3 Encounters:  12/29/20 (!) 319 lb 6.4 oz (144.9 kg)  12/07/20 (!) 316 lb (143.3 kg)  11/28/20 (!) 315 lb 12.8 oz (143.2 kg)    Physical Exam Vitals and nursing note reviewed.  Constitutional:      General: He is not in acute distress.    Appearance: Normal appearance. He is not ill-appearing or diaphoretic.  HENT:     Head: Normocephalic and atraumatic.     Right Ear: Tympanic membrane and external ear normal. There is no impacted cerumen.     Left Ear: External ear normal.     Nose: No congestion or rhinorrhea.     Mouth/Throat:     Pharynx: No oropharyngeal exudate or posterior oropharyngeal erythema.  Eyes:     Conjunctiva/sclera: Conjunctivae normal.     Pupils: Pupils  are equal, round, and reactive to light.  Cardiovascular:     Rate and Rhythm: Normal rate and regular rhythm.     Heart sounds: No murmur heard.   No friction rub. No gallop.  Pulmonary:     Effort: No respiratory distress.     Breath sounds: No stridor. No wheezing or rhonchi.  Chest:     Chest wall: No tenderness.  Abdominal:     General: Abdomen is flat. Bowel sounds are normal.     Palpations: Abdomen is soft. There is no mass.     Tenderness: There is no abdominal tenderness.  Musculoskeletal:     Cervical back: Normal range of motion and neck supple. No rigidity or  tenderness.     Left lower leg: No edema.  Skin:    General: Skin is warm and dry.  Neurological:     Mental Status: He is alert.    Results for orders placed or performed in visit on 12/07/20  Gamma GT  Result Value Ref Range   GGT 28 0 - 65 IU/L        Current Outpatient Medications:    acetaminophen (TYLENOL) 500 MG tablet, Take 1,000 mg by mouth every 6 (six) hours as needed for moderate pain or headache., Disp: , Rfl:    amLODipine (NORVASC) 10 MG tablet, Take 1 tablet (10 mg total) by mouth daily. Needs appt for further refills, Disp: 90 tablet, Rfl: 4   atorvastatin (LIPITOR) 40 MG tablet, Take 1 tablet (40 mg total) by mouth daily. Needs appt for further refills, Disp: 90 tablet, Rfl: 4   GNP GAS RELIEF 80 MG chewable tablet, Chew by mouth., Disp: , Rfl:    losartan (COZAAR) 100 MG tablet, TAKE 1 TABLET BY MOUTH ONCE DAILY., Disp: 30 tablet, Rfl: 3   omeprazole (PRILOSEC) 20 MG capsule, Take 1 capsule (20 mg total) by mouth daily., Disp: 90 capsule, Rfl: 4   pramipexole (MIRAPEX) 1 MG tablet, Take 1 tablet (1 mg total) by mouth 2 (two) times daily., Disp: 120 tablet, Rfl: 5   pregabalin (LYRICA) 100 MG capsule, Take by mouth daily as needed., Disp: , Rfl:     Assessment & Plan:  Abdominal pain / bloating better ? Sec to ozempic/ to fu with D.r tahilliani for subcentimeter hypodense lesion in left lobe of the liver - to see him back in 3 months.   2 . Post herpetic neuralgia is on lyrica for such 100 mg q pm.   3. Obesity has done weight watchers.  Lifestyle modifications advised to pt. A1c at   Portion control and avoiding high carb low fat diet advised.  Diet plan given to pt   exercise plan given and encouraged.  To increase exercise to 150 mins a week ie 21/2 hours a week. Pt verbalises understanding of the above.   4. Elevated alkaline phoshatase.  Results for LUM, STILLINGER (MRN [id number]) as of 12/29/2020 14:16  Ref. Range 11/25/2020 23:43 11/26/2020 03:57 11/26/2020  04:41 12/05/2020 11:27  Alkaline Phosphatase Latest Ref Range: 44 - 121 IU/L 110   124 (H)    5. Sleep apnea  ;;sees sleep medicine.  To fu with sleep clinic.  Is on mirapex for RLS4  6. HLD  Is on lipitor 40 mg daily. recheck FLP, check LFT's work on diet, SE of meds explained to pt. low fat and high fiber diet explained to pt.  Problem List Items Addressed This Visit   None  No orders of the defined types were placed in this encounter.    No orders of the defined types were placed in this encounter.    Follow up plan: No follow-ups on file.

## 2021-03-09 ENCOUNTER — Ambulatory Visit: Payer: 59 | Admitting: Gastroenterology

## 2021-03-10 NOTE — Progress Notes (Signed)
[facility] [address] [place], [place] [address]  Pulmonary Sleep Medicine   Office Visit Note  Patient Name: Christopher Hull DOB: Jul 30, 1965 MRN [id number]    Chief Complaint: Obstructive Sleep Apnea visit  Brief History:  Jovonte is seen today for 6 month follow up The patient has a 6.5 year history of sleep apnea. Patient is using PAP nightly @ BiPAP auto-.  The patient feels better and less sleepy. after sleeping with PAP.  The patient reports benefiting from PAP use. Reported sleepiness is  resolved and the Epworth Sleepiness Score is 0 out of 24. The patient does not take naps. The patient complains of the following: no problems  The compliance download shows  compliance with an average use time of 8:17 hours @ 99%. The AHI is 5.5  The patient does complain of limb movements disrupting sleep but he is doing well with pramipexole overall.  ROS  General: (-) fever, (-) chills, (-) night sweat Nose and Sinuses: (-) nasal stuffiness or itchiness, (-) postnasal drip, (-) nosebleeds, (-) sinus trouble. Mouth and Throat: (-) sore throat, (-) hoarseness. Neck: (-) swollen glands, (-) enlarged thyroid, (-) neck pain. Respiratory: - cough, - shortness of breath, - wheezing. Neurologic: - numbness, - tingling. Psychiatric: - anxiety, - depression   Current Medication: Outpatient Encounter Medications as of 03/13/2021  Medication Sig   acetaminophen (TYLENOL) 500 MG tablet Take 1,000 mg by mouth every 6 (six) hours as needed for moderate pain or headache.   amLODipine (NORVASC) 10 MG tablet Take 1 tablet (10 mg total) by mouth daily. Needs appt for further refills   atorvastatin (LIPITOR) 40 MG tablet Take 1 tablet (40 mg total) by mouth daily. Needs appt for further refills   GNP GAS RELIEF 80 MG chewable tablet Chew by mouth.   losartan (COZAAR) 100 MG tablet TAKE 1 TABLET BY MOUTH ONCE DAILY.   omeprazole (PRILOSEC) 20 MG capsule Take 1 capsule (20 mg total) by mouth  daily.   pramipexole (MIRAPEX) 1 MG tablet Take 1 tablet (1 mg total) by mouth 2 (two) times daily.   pregabalin (LYRICA) 100 MG capsule Take by mouth daily as needed.   No facility-administered encounter medications on file as of 03/13/2021.    Surgical History: History reviewed. No pertinent surgical history.  Medical History: Past Medical History:  Diagnosis Date   Essential hypertension    Hyperlipidemia    Morbid obesity (HCC)    Sleep apnea     Family History: Non contributory to the present illness  Social History: Social History   Socioeconomic History   Marital status: Married    Spouse name: Not on file   Number of children: Not on file   Years of education: Not on file   Highest education level: Not on file  Occupational History   Not on file  Tobacco Use   Smoking status: Never   Smokeless tobacco: Never  Vaping Use   Vaping Use: Never used  Substance and Sexual Activity   Alcohol use: Yes    Comment: rare   Drug use: No   Sexual activity: Not on file  Other Topics Concern   Not on file  Social History Narrative   Not on file   Social Determinants of Health   Financial Resource Strain: Not on file  Food Insecurity: Not on file  Transportation Needs: Not on file  Physical Activity: Not on file  Stress: Not on file  Social Connections: Not on file  Intimate Partner Violence: Not on file    Vital Signs: Blood pressure (!) 158/74, pulse 74, resp. rate 18, height 6\' 1"  (1.854 m), weight (!) 315 lb 11.2 oz (143.2 kg), SpO2 93 %.  Examination: General Appearance: The patient is well-developed, well-nourished, and in no distress. Neck Circumference: 44cm Skin: Gross inspection of skin unremarkable. Head: normocephalic, no gross deformities. Eyes: no gross deformities noted. ENT: ears appear grossly normal Neurologic: Alert and oriented. No involuntary movements.    EPWORTH SLEEPINESS SCALE:  Scale:  (0)= no chance of dozing; (1)= slight  chance of dozing; (2)= moderate chance of dozing; (3)= high chance of dozing  Chance  Situtation    Sitting and reading: 0    Watching TV: 0    Sitting Inactive in public: 0    As a passenger in car: 0      Lying down to rest: 0    Sitting and talking: 0    Sitting quielty after lunch: 0    In a car, stopped in traffic: 0   TOTAL SCORE:   0 out of 24    SLEEP STUDIES:  PSG 09/25/14 - AHI 96.7,  SpO2 min. 49%   CPAP COMPLIANCE DATA:  Date Range: 03/13/20 - 03/12/21  Average Daily Use: 8:17 hours  Median Use: 8:29 HOURS   Compliance for > 4 Hours: 99% days  AHI: 5.3 respiratory events per hour  Days Used: 363/365  Mask Leak: 5.6  95th Percentile Pressure: VPAP AUTO 25/17, 4 PS         LABS: No results found for this or any previous visit (from the past 2160 hour(s)).  Radiology: DG Chest 2 View  Result Date: 11/26/2020 CLINICAL DATA:  55 year old male with shortness of breath EXAM: CHEST - 2 VIEW COMPARISON:  Chest radiograph dated 06/30/2014 FINDINGS: No focal consolidation, pleural effusion, pneumothorax. The cardiac silhouette is within limits. No acute osseous pathology. IMPRESSION: No active cardiopulmonary disease. Electronically Signed   By: 08/29/2014 M.D.   On: 11/26/2020 00:26   DG Abdomen 1 View  Result Date: 11/26/2020 CLINICAL DATA:  55 year old male with abdominal distension. EXAM: ABDOMEN - 1 VIEW COMPARISON:  None. FINDINGS: Multiple dilated and air-filled loops of small bowel measure up to 5.3 cm. Air is noted in the proximal transverse colon. No free air or radiopaque calculi identified. The osseous structures and soft tissues are unremarkable. IMPRESSION: Small bowel obstruction versus ileus. Electronically Signed   By: 53 M.D.   On: 11/26/2020 00:29   CT Abdomen Pelvis W Contrast  Result Date: 11/26/2020 CLINICAL DATA:  55 year old male with abdominal pain. EXAM: CT ABDOMEN AND PELVIS WITH CONTRAST TECHNIQUE:  Multidetector CT imaging of the abdomen and pelvis was performed using the standard protocol following bolus administration of intravenous contrast. CONTRAST:  53 OMNIPAQUE IOHEXOL 300 MG/ML  SOLN COMPARISON:  Abdominal radiograph dated 11/26/2020. FINDINGS: Lower chest: The visualized lung bases are clear. No intra-abdominal free air or free fluid. Hepatobiliary: Subcentimeter hypodense lesion in the left lobe of the liver is too small to characterize. The liver is otherwise unremarkable. No intrahepatic biliary dilatation. The gallbladder is unremarkable. Pancreas: Unremarkable. No pancreatic ductal dilatation or surrounding inflammatory changes. Spleen: Normal in size without focal abnormality. Adrenals/Urinary Tract: The adrenal glands are unremarkable. Nonobstructing left renal calculi measure up to 4 mm in the inferior pole of the left kidney. No hydronephrosis. The right kidney is unremarkable. The visualized ureters and urinary bladder appear unremarkable. Stomach/Bowel: There is sigmoid diverticulosis  without active inflammatory changes. Loose stool noted within the colon compatible with diarrheal state. There is dilatation of proximal and mid small bowel loops measuring up to 4.3 cm. The distal small bowel loops are collapsed. There is gradual tapering in the right lower quadrant likely representing an ileus. A small-bowel obstruction is not excluded. Small-bowel series study may provide better evaluation. The appendix is normal. Vascular/Lymphatic: Mild aortoiliac atherosclerotic disease. The IVC is unremarkable. No portal venous gas. There is no adenopathy. Reproductive: The prostate and seminal vesicles are grossly unremarkable. No pelvic mass. Other: None Musculoskeletal: No acute or significant osseous findings. IMPRESSION: 1. Ileus versus possible small-bowel obstruction with transition in the right lower quadrant. Small-bowel series study may provide better evaluation. 2. Sigmoid diverticulosis.  3. Nonobstructing left renal calculi. No hydronephrosis. 4. Aortic Atherosclerosis (ICD10-I70.0). Electronically Signed   By: Elgie Collard M.D.   On: 11/26/2020 03:18    No results found.  No results found.    Assessment and Plan: Patient Active Problem List   Diagnosis Date Noted   Ileus (HCC) 11/26/2020   Heart burn 12/10/2019   Morbid obesity (HCC) 04/07/2015   Sleep apnea 04/07/2015   Essential hypertension    Hyperlipidemia    1. OSA treated with BiPAP The patient does tolerate PAP and reports definite benefit from PAP use. The patient was reminded how to clean equipment and advised to replace supplies routinely. The patient was also counselled on weight loss. The compliance is excellent. The AHI is 5.3, we will adjust his EPAP to 18.    OSA treated with  bipap. Increase epap to 18,. Download in 2 weeks.   2. Morbid obesity (HCC) Obesity Counseling: Had a lengthy discussion regarding patients BMI and weight issues. Patient was instructed on portion control as well as increased activity. Also discussed caloric restrictions with trying to maintain intake less than 2000 Kcal. Discussions were made in accordance with the 5As of weight management. Simple actions such as not eating late and if able to, taking a walk is suggested.   3. Essential hypertension Hypertension Counseling:   The following hypertensive lifestyle modification were recommended and discussed:  1. Limiting alcohol intake to less than 1 oz/day of ethanol:(24 oz of beer or 8 oz of wine or 2 oz of 100-proof whiskey). 2. Take baby ASA 81 mg daily. 3. Importance of regular aerobic exercise and losing weight. 4. Reduce dietary saturated fat and cholesterol intake for overall cardiovascular health. 5. Maintaining adequate dietary potassium, calcium, and magnesium intake. 6. Regular monitoring of the blood pressure. 7. Reduce sodium intake to less than 100 mmol/day (less than 2.3 gm of sodium or less than 6 gm of  sodium choride)      General Counseling: I have discussed the findings of the evaluation and examination with Janari.  I have also discussed any further diagnostic evaluation thatmay be needed or ordered today. Gaven verbalizes understanding of the findings of todays visit. We also reviewed his medications today and discussed drug interactions and side effects including but not limited excessive drowsiness and altered mental states. We also discussed that there is always a risk not just to him but also people around him. he has been encouraged to call the office with any questions or concerns that should arise related to todays visit.  No orders of the defined types were placed in this encounter.       I have personally obtained a history, examined the patient, evaluated laboratory and imaging results, formulated the  assessment and plan and placed orders. This patient was seen today by Emmaline Kluver, PA-C in collaboration with Dr. Freda Munro.   Yevonne Pax, MD [facility] Diplomate ABMS Pulmonary Critical Care Medicine and Sleep Medicine

## 2021-03-13 ENCOUNTER — Ambulatory Visit (INDEPENDENT_AMBULATORY_CARE_PROVIDER_SITE_OTHER): Payer: 59 | Admitting: Internal Medicine

## 2021-03-13 ENCOUNTER — Other Ambulatory Visit: Payer: Self-pay

## 2021-03-13 VITALS — BP 158/74 | HR 74 | Resp 18 | Ht 73.0 in | Wt 315.7 lb

## 2021-03-13 DIAGNOSIS — I10 Essential (primary) hypertension: Secondary | ICD-10-CM | POA: Diagnosis not present

## 2021-03-13 DIAGNOSIS — G4733 Obstructive sleep apnea (adult) (pediatric): Secondary | ICD-10-CM | POA: Diagnosis not present

## 2021-03-13 NOTE — Patient Instructions (Signed)

## 2021-03-14 ENCOUNTER — Other Ambulatory Visit: Payer: Self-pay | Admitting: Nurse Practitioner

## 2021-03-14 DIAGNOSIS — E782 Mixed hyperlipidemia: Secondary | ICD-10-CM

## 2021-03-14 DIAGNOSIS — I10 Essential (primary) hypertension: Secondary | ICD-10-CM

## 2021-03-14 NOTE — Telephone Encounter (Signed)
Requested Prescriptions  Pending Prescriptions Disp Refills  . atorvastatin (LIPITOR) 40 MG tablet [Pharmacy Med Name: ATORVASTATIN 40 MG TABLET] 90 tablet 0    Sig: TAKE 1 TABLET BY MOUTH ONCE DAILY.     Cardiovascular:  Antilipid - Statins Failed - 03/14/2021  1:18 PM      Failed - HDL in normal range and within 360 days    HDL  Date Value Ref Range Status  11/07/2020 38 (L) >39 mg/dL Final         Passed - Total Cholesterol in normal range and within 360 days    Cholesterol, Total  Date Value Ref Range Status  11/07/2020 149 100 - 199 mg/dL Final         Passed - LDL in normal range and within 360 days    LDL Chol Calc (NIH)  Date Value Ref Range Status  11/07/2020 97 0 - 99 mg/dL Final         Passed - Triglycerides in normal range and within 360 days    Triglycerides  Date Value Ref Range Status  11/07/2020 68 0 - 149 mg/dL Final         Passed - Patient is not pregnant      Passed - Valid encounter within last 12 months    Recent Outpatient Visits          2 months ago Morbid obesity (HCC)   [facility] Vigg, Avanti, MD   3 months ago Ileus (HCC)   [facility] Vigg, Avanti, MD   4 months ago Herpes zoster with complication   [facility] Vigg, Avanti, MD   4 months ago Herpes zoster with complication   [facility] Vigg, Avanti, MD   1 year ago Morbid obesity (HCC)   [facility] [place], Dorie Rank, NP      Future Appointments            In 2 weeks Vigg, Avanti, MD [facility]           . amLODipine (NORVASC) 10 MG tablet [Pharmacy Med Name: AMLODIPINE BESYLATE 10 MG TAB] 90 tablet 0    Sig: TAKE 1 TABLET BY MOUTH ONCE DAILY.     Cardiovascular:  Calcium Channel Blockers Failed - 03/14/2021  1:18 PM      Failed - Last BP in normal range    BP Readings from Last 1 Encounters:  03/13/21 (!) 158/74         Passed - Valid encounter within last 6 months    Recent  Outpatient Visits          2 months ago Morbid obesity (HCC)   [facility] Vigg, Avanti, MD   3 months ago Ileus (HCC)   [facility] Vigg, Avanti, MD   4 months ago Herpes zoster with complication   [facility] Vigg, Avanti, MD   4 months ago Herpes zoster with complication   [facility] Vigg, Avanti, MD   1 year ago Morbid obesity (HCC)   [facility] [place], Dorie Rank, NP      Future Appointments            In 2 weeks Vigg, Avanti, MD [facility]           . omeprazole (PRILOSEC) 20 MG capsule [Pharmacy Med Name: OMEPRAZOLE DR 20 MG CAPSULE] 90 capsule 0    Sig: TAKE (1) CAPSULE BY MOUTH ONCE  DAILY.     Gastroenterology: Proton Pump Inhibitors Passed - 03/14/2021  1:18 PM      Passed - Valid encounter within last 12 months    Recent Outpatient Visits          2 months ago Morbid obesity (HCC)   [facility] Vigg, Avanti, MD   3 months ago Ileus (HCC)   [facility] Vigg, Avanti, MD   4 months ago Herpes zoster with complication   [facility] Vigg, Avanti, MD   4 months ago Herpes zoster with complication   [facility] Vigg, Avanti, MD   1 year ago Morbid obesity (HCC)   [facility] [place], Dorie Rank, NP      Future Appointments            In 2 weeks Vigg, Avanti, MD [facility]

## 2021-03-22 ENCOUNTER — Other Ambulatory Visit: Payer: Self-pay

## 2021-03-22 NOTE — Telephone Encounter (Signed)
Patient is requesting refill of Pregabalin 100 mg QD.   Patient last seen on 12/29/20  Upcoming appt is on 03/31/21

## 2021-03-23 MED ORDER — PREGABALIN 100 MG PO CAPS: 100.0000 mg | ORAL_CAPSULE | Freq: Every day | ORAL | 0 refills | Status: DC

## 2021-03-24 ENCOUNTER — Other Ambulatory Visit: Payer: Self-pay

## 2021-03-24 ENCOUNTER — Other Ambulatory Visit: Payer: 59

## 2021-03-24 DIAGNOSIS — E7800 Pure hypercholesterolemia, unspecified: Secondary | ICD-10-CM

## 2021-03-24 DIAGNOSIS — I10 Essential (primary) hypertension: Secondary | ICD-10-CM

## 2021-03-24 LAB — MICROALBUMIN, URINE WAIVED
Creatinine, Urine Waived: 300 mg/dL (ref 10–300)
Microalb, Ur Waived: 80 mg/L — ABNORMAL HIGH (ref 0–19)

## 2021-03-24 LAB — BAYER DCA HB A1C WAIVED: HB A1C (BAYER DCA - WAIVED): 5.5 % (ref 4.8–5.6)

## 2021-03-25 LAB — CBC WITH DIFFERENTIAL/PLATELET
Basophils Absolute: 0.1 10*3/uL (ref 0.0–0.2)
Basos: 1 %
EOS (ABSOLUTE): 0.2 10*3/uL (ref 0.0–0.4)
Eos: 2 %
Hematocrit: 46.6 % (ref 37.5–51.0)
Hemoglobin: 15.6 g/dL (ref 13.0–17.7)
Immature Grans (Abs): 0.1 10*3/uL (ref 0.0–0.1)
Immature Granulocytes: 1 %
Lymphocytes Absolute: 2.3 10*3/uL (ref 0.7–3.1)
Lymphs: 31 %
MCH: 31.2 pg (ref 26.6–33.0)
MCHC: 33.5 g/dL (ref 31.5–35.7)
MCV: 93 fL (ref 79–97)
Monocytes Absolute: 0.7 10*3/uL (ref 0.1–0.9)
Monocytes: 9 %
Neutrophils Absolute: 4.3 10*3/uL (ref 1.4–7.0)
Neutrophils: 56 %
Platelets: 282 10*3/uL (ref 150–450)
RBC: 5 x10E6/uL (ref 4.14–5.80)
RDW: 11.7 % (ref 11.6–15.4)
WBC: 7.5 10*3/uL (ref 3.4–10.8)

## 2021-03-25 LAB — PSA TOTAL+% FREE (SERIAL)
PSA, Free Pct: 33.1 %
PSA, Free: 0.43 ng/mL
Prostate Specific Ag, Serum: 1.3 ng/mL (ref 0.0–4.0)

## 2021-03-25 LAB — COMPREHENSIVE METABOLIC PANEL
ALT: 21 IU/L (ref 0–44)
AST: 18 IU/L (ref 0–40)
Albumin/Globulin Ratio: 1.6 (ref 1.2–2.2)
Albumin: 4.5 g/dL (ref 3.8–4.9)
Alkaline Phosphatase: 153 IU/L — ABNORMAL HIGH (ref 44–121)
BUN/Creatinine Ratio: 27 — ABNORMAL HIGH (ref 9–20)
BUN: 20 mg/dL (ref 6–24)
Bilirubin Total: 0.5 mg/dL (ref 0.0–1.2)
CO2: 26 mmol/L (ref 20–29)
Calcium: 9.8 mg/dL (ref 8.7–10.2)
Chloride: 103 mmol/L (ref 96–106)
Creatinine, Ser: 0.74 mg/dL — ABNORMAL LOW (ref 0.76–1.27)
Globulin, Total: 2.9 g/dL (ref 1.5–4.5)
Glucose: 101 mg/dL — ABNORMAL HIGH (ref 70–99)
Potassium: 4 mmol/L (ref 3.5–5.2)
Sodium: 142 mmol/L (ref 134–144)
Total Protein: 7.4 g/dL (ref 6.0–8.5)
eGFR: 107 mL/min/{1.73_m2} (ref >59)

## 2021-03-25 LAB — THYROID PANEL WITH TSH
Free Thyroxine Index: 2 (ref 1.2–4.9)
T3 Uptake Ratio: 26 % (ref 24–39)
T4, Total: 7.7 ug/dL (ref 4.5–12.0)
TSH: 2.28 u[IU]/mL (ref 0.450–4.500)

## 2021-03-25 LAB — LIPID PANEL
Chol/HDL Ratio: 3.9 ratio (ref 0.0–5.0)
Cholesterol, Total: 142 mg/dL (ref 100–199)
HDL: 36 mg/dL — ABNORMAL LOW (ref >39)
LDL Chol Calc (NIH): 90 mg/dL (ref 0–99)
Triglycerides: 85 mg/dL (ref 0–149)
VLDL Cholesterol Cal: 16 mg/dL (ref 5–40)

## 2021-03-31 ENCOUNTER — Ambulatory Visit: Payer: 59 | Admitting: Internal Medicine

## 2021-03-31 ENCOUNTER — Telehealth (INDEPENDENT_AMBULATORY_CARE_PROVIDER_SITE_OTHER): Payer: 59 | Admitting: Internal Medicine

## 2021-03-31 ENCOUNTER — Encounter: Payer: Self-pay | Admitting: Internal Medicine

## 2021-03-31 DIAGNOSIS — E782 Mixed hyperlipidemia: Secondary | ICD-10-CM | POA: Diagnosis not present

## 2021-03-31 MED ORDER — ATORVASTATIN CALCIUM 20 MG PO TABS: 20.0000 mg | ORAL_TABLET | Freq: Every day | ORAL | 3 refills | Status: AC

## 2021-03-31 NOTE — Patient Instructions (Signed)
Avoid Tyelnol , avoid alcohol and cut back on lyrica and lipitor.

## 2021-03-31 NOTE — Progress Notes (Signed)
There were no vitals taken for this visit.   Subjective:    Patient ID: Christopher Hull, male    DOB: [birth year], 55 y.o.   MRN: [id number]  Chief Complaint  Patient presents with   Hyperlipidemia   obstructive sleep disorder    HPI: Christopher Hull is a 55 y.o. male  Alkaline phosphatase noted on Labs. Pt is well , Avoid Tyelnol , avoid alcohol and cut back on lyrica and lipitor.   Hyperlipidemia This is a chronic problem. The problem is controlled. He has no history of chronic renal disease or hypothyroidism.   Chief Complaint  Patient presents with   Hyperlipidemia   obstructive sleep disorder    Relevant past medical, surgical, family and social history reviewed and updated as indicated. Interim medical history since our last visit reviewed. Allergies and medications reviewed and updated.  Review of Systems  Per HPI unless specifically indicated above     Objective:    There were no vitals taken for this visit.  Wt Readings from Last 3 Encounters:  03/13/21 (!) 315 lb 11.2 oz (143.2 kg)  12/29/20 (!) 319 lb 6.4 oz (144.9 kg)  12/07/20 (!) 316 lb (143.3 kg)    Physical Exam Constitutional:      General: He is not in acute distress.    Appearance: He is ill-appearing. He is not toxic-appearing or diaphoretic.  HENT:     Head: Normocephalic and atraumatic.     Nose: No congestion or rhinorrhea.  Neurological:     Mental Status: He is alert.  Psychiatric:        Mood and Affect: Mood normal.        Behavior: Behavior normal.        Thought Content: Thought content normal.        Judgment: Judgment normal.    Results for orders placed or performed in visit on 03/24/21  Microalbumin, Urine Waived  Result Value Ref Range   Microalb, Ur Waived 80 (H) 0 - 19 mg/L   Creatinine, Urine Waived 300 10 - 300 mg/dL   Microalb/Creat Ratio 30-300 (H) <30 mg/g  Lipid panel  Result Value Ref Range   Cholesterol, Total 142 100 - 199 mg/dL   Triglycerides 85 0 -  149 mg/dL   HDL 36 (L) >39 mg/dL   VLDL Cholesterol Cal 16 5 - 40 mg/dL   LDL Chol Calc (NIH) 90 0 - 99 mg/dL   Chol/HDL Ratio 3.9 0.0 - 5.0 ratio  Bayer DCA Hb A1c Waived  Result Value Ref Range   HB A1C (BAYER DCA - WAIVED) 5.5 4.8 - 5.6 %  Thyroid Panel With TSH  Result Value Ref Range   TSH 2.280 0.450 - 4.500 uIU/mL   T4, Total 7.7 4.5 - 12.0 ug/dL   T3 Uptake Ratio 26 24 - 39 %   Free Thyroxine Index 2.0 1.2 - 4.9  CBC with Differential/Platelet  Result Value Ref Range   WBC 7.5 3.4 - 10.8 x10E3/uL   RBC 5.00 4.14 - 5.80 x10E6/uL   Hemoglobin 15.6 13.0 - 17.7 g/dL   Hematocrit 46.6 37.5 - 51.0 %   MCV 93 79 - 97 fL   MCH 31.2 26.6 - 33.0 pg   MCHC 33.5 31.5 - 35.7 g/dL   RDW 11.7 11.6 - 15.4 %   Platelets 282 150 - 450 x10E3/uL   Neutrophils 56 Not Estab. %   Lymphs 31 Not Estab. %   Monocytes 9 Not Estab. %  Eos 2 Not Estab. %   Basos 1 Not Estab. %   Neutrophils Absolute 4.3 1.4 - 7.0 x10E3/uL   Lymphocytes Absolute 2.3 0.7 - 3.1 x10E3/uL   Monocytes Absolute 0.7 0.1 - 0.9 x10E3/uL   EOS (ABSOLUTE) 0.2 0.0 - 0.4 x10E3/uL   Basophils Absolute 0.1 0.0 - 0.2 x10E3/uL   Immature Granulocytes 1 Not Estab. %   Immature Grans (Abs) 0.1 0.0 - 0.1 x10E3/uL  Comprehensive metabolic panel  Result Value Ref Range   Glucose 101 (H) 70 - 99 mg/dL   BUN 20 6 - 24 mg/dL   Creatinine, Ser 0.74 (L) 0.76 - 1.27 mg/dL   eGFR 107 >59 mL/min/1.73   BUN/Creatinine Ratio 27 (H) 9 - 20   Sodium 142 134 - 144 mmol/L   Potassium 4.0 3.5 - 5.2 mmol/L   Chloride 103 96 - 106 mmol/L   CO2 26 20 - 29 mmol/L   Calcium 9.8 8.7 - 10.2 mg/dL   Total Protein 7.4 6.0 - 8.5 g/dL   Albumin 4.5 3.8 - 4.9 g/dL   Globulin, Total 2.9 1.5 - 4.5 g/dL   Albumin/Globulin Ratio 1.6 1.2 - 2.2   Bilirubin Total 0.5 0.0 - 1.2 mg/dL   Alkaline Phosphatase 153 (H) 44 - 121 IU/L   AST 18 0 - 40 IU/L   ALT 21 0 - 44 IU/L  PSA Total+%Free (Serial)  Result Value Ref Range   Prostate Specific Ag, Serum 1.3  0.0 - 4.0 ng/mL   PSA, Free 0.43 N/A ng/mL   PSA, Free Pct 33.1 %        Current Outpatient Medications:    acetaminophen (TYLENOL) 500 MG tablet, Take 1,000 mg by mouth every 6 (six) hours as needed for moderate pain or headache., Disp: , Rfl:    amLODipine (NORVASC) 10 MG tablet, TAKE 1 TABLET BY MOUTH ONCE DAILY., Disp: 90 tablet, Rfl: 0   atorvastatin (LIPITOR) 40 MG tablet, TAKE 1 TABLET BY MOUTH ONCE DAILY., Disp: 90 tablet, Rfl: 0   GNP GAS RELIEF 80 MG chewable tablet, Chew by mouth., Disp: , Rfl:    losartan (COZAAR) 100 MG tablet, TAKE 1 TABLET BY MOUTH ONCE DAILY., Disp: 30 tablet, Rfl: 3   omeprazole (PRILOSEC) 20 MG capsule, TAKE (1) CAPSULE BY MOUTH ONCE DAILY., Disp: 90 capsule, Rfl: 0   pramipexole (MIRAPEX) 1 MG tablet, Take 1 tablet (1 mg total) by mouth 2 (two) times daily., Disp: 120 tablet, Rfl: 5   pregabalin (LYRICA) 100 MG capsule, Take 1 capsule (100 mg total) by mouth daily. needs appointment for further refills, Disp: 30 capsule, Rfl: 0    Assessment & Plan:  Post shingles rash disappeared.  Will reduce dose of lyrica to 50 mg dailly  Elevated Alk phosphatase - will cut back lipitor 20 mg.  Cut back lyrica to 50 mg if needed otherwise hold and stop Consider RUQ [place]  Recheck LFTS in 6 weeks.   HLD is on lipitor 40 mg    will cut back lipitor 20 mg. recheck FLP, check LFT's work on diet, SE of meds explained to pt. low fat and high fiber diet explained to pt.   RLS is on mirapex cut nack next visit if Lfts still high.   Problem List Items Addressed This Visit   None    Orders Placed This Encounter  Procedures   Comprehensive metabolic panel   Lipid panel     Meds ordered this encounter  Medications   atorvastatin (LIPITOR)  20 MG tablet    Sig: Take 1 tablet (20 mg total) by mouth daily.    Dispense:  90 tablet    Refill:  3     Follow up plan: No follow-ups on file.  This visit was completed via video visit through MyChart due to the  restrictions of the COVID-19 pandemic. All issues as above were discussed and addressed. Physical exam was done as above through visual confirmation on video through MyChart. If it was felt that the patient should be evaluated in the office, they were directed there. The patient verbally consented to this visit. Location of the patient: home Location of the provider: work Those involved with this call:  Provider: Charlynne Cousins, MD CMA: Frazier Butt, [place] Desk/Registration: Myrlene Broker  Time spent on call:  15 minutes with patient face to face via video conference. More than 50% of this time was spent in counseling and coordination of care. 10 minutes total spent in review of patient's record and preparation of their chart.

## 2021-06-26 ENCOUNTER — Other Ambulatory Visit: Payer: Self-pay | Admitting: Internal Medicine

## 2021-06-26 DIAGNOSIS — I10 Essential (primary) hypertension: Secondary | ICD-10-CM

## 2021-06-27 NOTE — Telephone Encounter (Signed)
Lmom asking pt to call back to schedule an appt. Pt was due back for fu in December

## 2021-06-27 NOTE — Telephone Encounter (Signed)
Requested medications are due for refill today.  yes  Requested medications are on the active medications list.  yes  Last refill. Amlodipine 03/14/2021 #90 0 refills, Omeprazole 03/14/2021 #90 0 refills.  Future visit scheduled.   no  Notes to clinic.  Per note from OV of 03/31/2021 pt was to RTC for recheck 04/28/2021.    Requested Prescriptions  Pending Prescriptions Disp Refills   amLODipine (NORVASC) 10 MG tablet [Pharmacy Med Name: AMLODIPINE BESYLATE 10 MG TAB] 90 tablet 0    Sig: TAKE 1 TABLET BY MOUTH ONCE DAILY.     Cardiovascular: Calcium Channel Blockers 2 Failed - 06/26/2021  1:00 PM      Failed - Last BP in normal range    BP Readings from Last 1 Encounters:  03/13/21 (!) 158/74          Passed - Last Heart Rate in normal range    Pulse Readings from Last 1 Encounters:  03/13/21 74          Passed - Valid encounter within last 6 months    Recent Outpatient Visits           2 months ago Morbid obesity (HCC)   [facility] Vigg, Avanti, MD   6 months ago Morbid obesity (HCC)   [facility] Vigg, Avanti, MD   7 months ago Ileus (HCC)   [facility] Vigg, Avanti, MD   7 months ago Herpes zoster with complication   [facility] Vigg, Avanti, MD   7 months ago Herpes zoster with complication   [facility] Vigg, Avanti, MD               omeprazole (PRILOSEC) 20 MG capsule [Pharmacy Med Name: OMEPRAZOLE DR 20 MG CAPSULE] 90 capsule 0    Sig: TAKE (1) CAPSULE BY MOUTH ONCE DAILY.     Gastroenterology: Proton Pump Inhibitors Passed - 06/26/2021  1:00 PM      Passed - Valid encounter within last 12 months    Recent Outpatient Visits           2 months ago Morbid obesity (HCC)   [facility] Vigg, Avanti, MD   6 months ago Morbid obesity (HCC)   [facility] Vigg, Avanti, MD   7 months ago Ileus (HCC)   [facility] Vigg, Avanti, MD   7 months ago Herpes  zoster with complication   [facility] Vigg, Avanti, MD   7 months ago Herpes zoster with complication   [facility] Vigg, Avanti, MD

## 2021-06-28 NOTE — Telephone Encounter (Signed)
Pt states he will call back to schedule an appt.  °

## 2021-06-28 NOTE — Telephone Encounter (Signed)
Patients wife called in about pt being told he has to schedule an appt for medication and she wnts to know why. Zi tried to explain but she started yelling,

## 2021-08-28 ENCOUNTER — Ambulatory Visit (INDEPENDENT_AMBULATORY_CARE_PROVIDER_SITE_OTHER): Payer: 59 | Admitting: Internal Medicine

## 2021-08-28 VITALS — BP 167/75 | HR 71 | Resp 16 | Ht 73.0 in | Wt 332.0 lb

## 2021-08-28 DIAGNOSIS — G2581 Restless legs syndrome: Secondary | ICD-10-CM

## 2021-08-28 DIAGNOSIS — G4733 Obstructive sleep apnea (adult) (pediatric): Secondary | ICD-10-CM | POA: Diagnosis not present

## 2021-08-28 DIAGNOSIS — I10 Essential (primary) hypertension: Secondary | ICD-10-CM | POA: Diagnosis not present

## 2021-08-28 MED ORDER — PRAMIPEXOLE DIHYDROCHLORIDE 1 MG PO TABS: 1.0000 mg | ORAL_TABLET | Freq: Two times a day (BID) | ORAL | 5 refills | Status: DC

## 2021-08-28 NOTE — Patient Instructions (Signed)

## 2021-08-28 NOTE — Progress Notes (Signed)
Christopher Hull ?[address] ?[place], [place] [address] ? ?Pulmonary Sleep Medicine  ? ?Office Visit Note ? ?Patient Name: Christopher Hull ?DOB: 09/25/1965 ?MRN [id number] ? ? ? ?Chief Complaint: Obstructive Sleep Apnea visit ? ?Brief History: ? ?Christopher Hull is seen today for follow up visit for face to face notes to get replacement. The patient has a 7 year history of sleep apnea. Patient is using PAP nightly.  The patient feels better and can't sleep without it. after sleeping with PAP.  The patient reports benefiting from PAP use.  Epworth Sleepiness Score is 0 out of 24. The patient does not take naps. The patient complains of the following: needs new machine because his current unit has reached max recommended.  The compliance download shows excellent compliance with an average use time of 8:25 hours. The AHI is 2.8  The patient occasionally complains  of limb movements disrupting sleep. He is taking pramipexole with good results.  ? ?ROS ? ?General: (-) fever, (-) chills, (-) night sweat ?Nose and Sinuses: (-) nasal stuffiness or itchiness, (-) postnasal drip, (-) nosebleeds, (-) sinus trouble. ?Mouth and Throat: (-) sore throat, (-) hoarseness. ?Neck: (-) swollen glands, (-) enlarged thyroid, (-) neck pain. ?Respiratory: - cough, - shortness of breath, - wheezing. ?Neurologic: - numbness, - tingling. ?Psychiatric: - anxiety, - depression ? ? ?Current Medication: ?Outpatient Encounter Medications as of 08/28/2021  ?Medication Sig  ? amLODipine (NORVASC) 10 MG tablet TAKE 1 TABLET BY MOUTH ONCE DAILY.  ? atorvastatin (LIPITOR) 20 MG tablet Take 1 tablet (20 mg total) by mouth daily.  ? GNP GAS RELIEF 80 MG chewable tablet Chew by mouth.  ? losartan (COZAAR) 100 MG tablet TAKE 1 TABLET BY MOUTH ONCE DAILY.  ? omeprazole (PRILOSEC) 20 MG capsule TAKE (1) CAPSULE BY MOUTH ONCE DAILY.  ? pramipexole (MIRAPEX) 1 MG tablet Take 1 tablet (1 mg total) by mouth 2 (two) times daily.  ? ?No facility-administered  encounter medications on file as of 08/28/2021.  ? ? ?Surgical History: ?No past surgical history on file. ? ?Medical History: ?Past Medical History:  ?Diagnosis Date  ? Essential hypertension   ? Hyperlipidemia   ? Morbid obesity ([place])   ? Sleep apnea   ? ? ?Family History: ?Non contributory to the present illness ? ?Social History: ?Social History  ? ?Socioeconomic History  ? Marital status: Married  ?  Spouse name: Not on file  ? Number of children: Not on file  ? Years of education: Not on file  ? Highest education level: Not on file  ?Occupational History  ? Not on file  ?Tobacco Use  ? Smoking status: Never  ? Smokeless tobacco: Never  ?Vaping Use  ? Vaping Use: Never used  ?Substance and Sexual Activity  ? Alcohol use: Yes  ?  Comment: rare  ? Drug use: No  ? Sexual activity: Not on file  ?Other Topics Concern  ? Not on file  ?Social History Narrative  ? Not on file  ? ?Social Determinants of Health  ? ?Financial Resource Strain: Not on file  ?Food Insecurity: Not on file  ?Transportation Needs: Not on file  ?Physical Activity: Not on file  ?Stress: Not on file  ?Social Connections: Not on file  ?Intimate Partner Violence: Not on file  ? ? ?Vital Signs: ?There were no vitals taken for this visit. ?There is no height or weight on file to calculate BMI.  ? ? ?Examination: ?General Appearance: The patient is well-developed, well-nourished,  and in no distress. ?Neck Circumference: 44 cm ?Skin: Gross inspection of skin unremarkable. ?Head: normocephalic, no gross deformities. ?Eyes: no gross deformities noted. ?ENT: ears appear grossly normal ?Neurologic: Alert and oriented. No involuntary movements. ? ? ? ?EPWORTH SLEEPINESS SCALE: ? ?Scale:  ?(0)= no chance of dozing; (1)= slight chance of dozing; (2)= moderate chance of dozing; (3)= high chance of dozing ? ?Chance  Situtation ?   ?Sitting and reading: 0 ?  ? Watching TV: 0 ?   ?Sitting Inactive in public: 0 ?   ?As a passenger in car: 0   ?   ?Lying down to  rest: 0 ?   ?Sitting and talking: 0 ?   ?Sitting quielty after lunch: 0 ?   ?In a car, stopped in traffic: 0 ? ? ?TOTAL SCORE:   0 out of 24 ? ? ? ?SLEEP STUDIES: ? ?PSG 09/25/14 - AHI 96.7,  SpO2 min. 49% ? ? ?CPAP COMPLIANCE DATA: ? ?Date Range: 07/29/21 - 08/27/21 ? ?Average Daily Use: 8:25 hours ? ?Median Use: 8:11 hours ? ?Compliance for > 4 Hours: 100% days ? ?AHI: 2.8 respiratory events per hour ? ?Days Used: 30/30 ? ?Mask Leak: 3.8 ? ?95th Percentile Pressure: V Auto ? ? ?LABS: ?No results found for this or any previous visit (from the past 2160 hour(s)). ? ?Radiology: ?DG Chest 2 View ? ?Result Date: 11/26/2020 ?CLINICAL DATA:  56 year old male with shortness of breath EXAM: CHEST - 2 VIEW COMPARISON:  Chest radiograph dated 06/30/2014 FINDINGS: No focal consolidation, pleural effusion, pneumothorax. The cardiac silhouette is within limits. No acute osseous pathology. IMPRESSION: No active cardiopulmonary disease. Electronically Signed   By: Anner Crete M.D.   On: 11/26/2020 00:26  ? ?DG Abdomen 1 View ? ?Result Date: 11/26/2020 ?CLINICAL DATA:  56 year old male with abdominal distension. EXAM: ABDOMEN - 1 VIEW COMPARISON:  None. FINDINGS: Multiple dilated and air-filled loops of small bowel measure up to 5.3 cm. Air is noted in the proximal transverse colon. No free air or radiopaque calculi identified. The osseous structures and soft tissues are unremarkable. IMPRESSION: Small bowel obstruction versus ileus. Electronically Signed   By: Anner Crete M.D.   On: 11/26/2020 00:29  ? ?CT Abdomen Pelvis W Contrast ? ?Result Date: 11/26/2020 ?CLINICAL DATA:  56 year old male with abdominal pain. EXAM: CT ABDOMEN AND PELVIS WITH CONTRAST TECHNIQUE: Multidetector CT imaging of the abdomen and pelvis was performed using the standard protocol following bolus administration of intravenous contrast. CONTRAST:  159mL OMNIPAQUE IOHEXOL 300 MG/ML  SOLN COMPARISON:  Abdominal radiograph dated 11/26/2020. FINDINGS: Lower  chest: The visualized lung bases are clear. No intra-abdominal free air or free fluid. Hepatobiliary: Subcentimeter hypodense lesion in the left lobe of the liver is too small to characterize. The liver is otherwise unremarkable. No intrahepatic biliary dilatation. The gallbladder is unremarkable. Pancreas: Unremarkable. No pancreatic ductal dilatation or surrounding inflammatory changes. Spleen: Normal in size without focal abnormality. Adrenals/Urinary Tract: The adrenal glands are unremarkable. Nonobstructing left renal calculi measure up to 4 mm in the inferior pole of the left kidney. No hydronephrosis. The right kidney is unremarkable. The visualized ureters and urinary bladder appear unremarkable. Stomach/Bowel: There is sigmoid diverticulosis without active inflammatory changes. Loose stool noted within the colon compatible with diarrheal state. There is dilatation of proximal and mid small bowel loops measuring up to 4.3 cm. The distal small bowel loops are collapsed. There is gradual tapering in the right lower quadrant likely representing an ileus. A small-bowel obstruction is not  excluded. Small-bowel series study may provide better evaluation. The appendix is normal. Vascular/Lymphatic: Mild aortoiliac atherosclerotic disease. The IVC is unremarkable. No portal venous gas. There is no adenopathy. Reproductive: The prostate and seminal vesicles are grossly unremarkable. No pelvic mass. Other: None Musculoskeletal: No acute or significant osseous findings. IMPRESSION: 1. Ileus versus possible small-bowel obstruction with transition in the right lower quadrant. Small-bowel series study may provide better evaluation. 2. Sigmoid diverticulosis. 3. Nonobstructing left renal calculi. No hydronephrosis. 4. Aortic Atherosclerosis (ICD10-I70.0). Electronically Signed   By: Anner Crete M.D.   On: 11/26/2020 03:18  ? ? ?No results found. ? ?No results found. ? ? ? ?Assessment and Plan: ?Patient Active Problem  List  ? Diagnosis Date Noted  ? OSA treated with BiPAP 08/28/2021  ? RLS (restless legs syndrome) 08/28/2021  ? Ileus ([place]) 11/26/2020  ? Heart burn 12/10/2019  ? Morbid obesity ([place]) 04/07/2015  ? Sleep apnea 11

## 2021-10-03 ENCOUNTER — Other Ambulatory Visit: Payer: Self-pay | Admitting: Internal Medicine

## 2021-10-03 ENCOUNTER — Other Ambulatory Visit: Payer: Self-pay | Admitting: Nurse Practitioner

## 2021-10-03 DIAGNOSIS — I10 Essential (primary) hypertension: Secondary | ICD-10-CM

## 2021-10-03 NOTE — Telephone Encounter (Signed)
Requested medication (s) are due for refill today: yes ? ?Requested medication (s) are on the active medication list: yes ? ?Last refill:  12/21/20 #30/3 ? ?Future visit scheduled: no ? ?Notes to clinic: Unable to refill per protocol due to failed labs, no updated results.  ? ? ?  ?Requested Prescriptions  ?Pending Prescriptions Disp Refills  ? losartan (COZAAR) 100 MG tablet [Pharmacy Med Name: LOSARTAN POTASSIUM 100 MG TAB] 30 tablet 11  ?  Sig: TAKE 1 TABLET BY MOUTH ONCE DAILY.  ?  ? Cardiovascular:  Angiotensin Receptor Blockers Failed - 10/03/2021  7:34 AM  ?  ?  Failed - Cr in normal range and within 180 days  ?  Creatinine, Ser  ?Date Value Ref Range Status  ?03/24/2021 0.74 (L) 0.76 - 1.27 mg/dL Final  ?   ?  ?  Failed - K in normal range and within 180 days  ?  Potassium  ?Date Value Ref Range Status  ?03/24/2021 4.0 3.5 - 5.2 mmol/L Final  ?   ?  ?  Failed - Last BP in normal range  ?  BP Readings from Last 1 Encounters:  ?08/28/21 (!) 167/75  ?   ?  ?  Failed - Valid encounter within last 6 months  ?  Recent Outpatient Visits   ? ?      ? 6 months ago Morbid obesity (HCC)  ? [facility] Vigg, Avanti, MD  ? 9 months ago Morbid obesity (HCC)  ? [facility] Vigg, Avanti, MD  ? 10 months ago Ileus [facility])  ? [facility] Vigg, Avanti, MD  ? 10 months ago Herpes zoster with complication  ? [facility] Vigg, Avanti, MD  ? 11 months ago Herpes zoster with complication  ? [facility] Vigg, Avanti, MD  ? ?  ?  ? ? ?  ?  ?  Passed - Patient is not pregnant  ?  ?  ? ?

## 2021-10-04 NOTE — Telephone Encounter (Signed)
Requested Prescriptions  ?Pending Prescriptions Disp Refills  ?? amLODipine (NORVASC) 10 MG tablet [Pharmacy Med Name: AMLODIPINE BESYLATE 10 MG TAB] 90 tablet 0  ?  Sig: Take 1 tablet (10 mg total) by mouth daily. OFFICE VISIT NEEDED FOR ADDITIONAL REFILLS  ?  ? Cardiovascular: Calcium Channel Blockers 2 Failed - 10/03/2021  7:34 AM  ?  ?  Failed - Last BP in normal range  ?  BP Readings from Last 1 Encounters:  ?08/28/21 (!) 167/75  ?   ?  ?  Failed - Valid encounter within last 6 months  ?  Recent Outpatient Visits   ?      ? 6 months ago Morbid obesity (HCC)  ? [facility] Vigg, Avanti, MD  ? 9 months ago Morbid obesity (HCC)  ? [facility] Vigg, Avanti, MD  ? 10 months ago Ileus [facility])  ? [facility] Vigg, Avanti, MD  ? 10 months ago Herpes zoster with complication  ? [facility] Vigg, Avanti, MD  ? 11 months ago Herpes zoster with complication  ? [facility] Vigg, Avanti, MD  ?  ?  ? ?  ?  ?  Passed - Last Heart Rate in normal range  ?  Pulse Readings from Last 1 Encounters:  ?08/28/21 71  ?   ?  ?  ?? omeprazole (PRILOSEC) 20 MG capsule [Pharmacy Med Name: OMEPRAZOLE DR 20 MG CAPSULE] 30 capsule 11  ?  Sig: TAKE (1) CAPSULE BY MOUTH ONCE DAILY.  ?  ? Gastroenterology: Proton Pump Inhibitors Passed - 10/03/2021  7:34 AM  ?  ?  Passed - Valid encounter within last 12 months  ?  Recent Outpatient Visits   ?      ? 6 months ago Morbid obesity (HCC)  ? [facility] Vigg, Avanti, MD  ? 9 months ago Morbid obesity (HCC)  ? [facility] Vigg, Avanti, MD  ? 10 months ago Ileus [facility])  ? [facility] Vigg, Avanti, MD  ? 10 months ago Herpes zoster with complication  ? [facility] Vigg, Avanti, MD  ? 11 months ago Herpes zoster with complication  ? [facility] Vigg, Avanti, MD  ?  ?  ? ?  ?  ?  ? ?

## 2021-10-31 ENCOUNTER — Other Ambulatory Visit: Payer: Self-pay | Admitting: Internal Medicine

## 2021-10-31 DIAGNOSIS — I10 Essential (primary) hypertension: Secondary | ICD-10-CM

## 2021-11-01 NOTE — Telephone Encounter (Signed)
Requested medication (s) are due for refill today: yes  Requested medication (s) are on the active medication list: yes  Last refill:  10/03/21 #30/0  Future visit scheduled: no  Notes to clinic:  pt overdue for appt and updated labs. Pt called, LVMTCB     Requested Prescriptions  Pending Prescriptions Disp Refills   losartan (COZAAR) 100 MG tablet [Pharmacy Med Name: LOSARTAN POTASSIUM 100 MG TAB] 30 tablet 0    Sig: TAKE 1 TABLET BY MOUTH ONCE DAILY.     Cardiovascular:  Angiotensin Receptor Blockers Failed - 10/31/2021  1:02 PM      Failed - Cr in normal range and within 180 days    Creatinine, Ser  Date Value Ref Range Status  03/24/2021 0.74 (L) 0.76 - 1.27 mg/dL Final         Failed - K in normal range and within 180 days    Potassium  Date Value Ref Range Status  03/24/2021 4.0 3.5 - 5.2 mmol/L Final         Failed - Last BP in normal range    BP Readings from Last 1 Encounters:  08/28/21 (!) 167/75         Failed - Valid encounter within last 6 months    Recent Outpatient Visits           7 months ago Morbid obesity (HCC)   [facility] Vigg, Avanti, MD   10 months ago Morbid obesity (HCC)   [facility] Vigg, Avanti, MD   11 months ago Ileus (HCC)   [facility] Vigg, Avanti, MD   11 months ago Herpes zoster with complication   [facility] Vigg, Avanti, MD   11 months ago Herpes zoster with complication   [facility] Vigg, Avanti, MD              Passed - Patient is not pregnant

## 2021-11-01 NOTE — Telephone Encounter (Signed)
Left message to call and make appointment

## 2022-03-30 ENCOUNTER — Telehealth: Payer: Self-pay | Admitting: Internal Medicine

## 2022-03-30 NOTE — Telephone Encounter (Signed)
Compliance signed. Faxed back to [email or web address]

## 2022-05-24 ENCOUNTER — Other Ambulatory Visit: Payer: Self-pay | Admitting: Physician Assistant

## 2022-05-24 MED ORDER — PRAMIPEXOLE DIHYDROCHLORIDE 1 MG PO TABS: 1.0000 mg | ORAL_TABLET | Freq: Two times a day (BID) | ORAL | 5 refills | Status: AC

## 2022-10-16 ENCOUNTER — Emergency Department: Payer: 59

## 2022-10-16 ENCOUNTER — Emergency Department
Admission: EM | Admit: 2022-10-16 | Discharge: 2022-10-16 | Disposition: A | Discharge status: Home / Self Care | Payer: 59 | Attending: Emergency Medicine | Admitting: Emergency Medicine

## 2022-10-16 ENCOUNTER — Other Ambulatory Visit: Payer: Self-pay

## 2022-10-16 DIAGNOSIS — N132 Hydronephrosis with renal and ureteral calculous obstruction: Secondary | ICD-10-CM | POA: Insufficient documentation

## 2022-10-16 DIAGNOSIS — N1330 Unspecified hydronephrosis: Secondary | ICD-10-CM | POA: Diagnosis not present

## 2022-10-16 DIAGNOSIS — N200 Calculus of kidney: Secondary | ICD-10-CM | POA: Diagnosis not present

## 2022-10-16 DIAGNOSIS — R519 Headache, unspecified: Secondary | ICD-10-CM | POA: Insufficient documentation

## 2022-10-16 DIAGNOSIS — I700 Atherosclerosis of aorta: Secondary | ICD-10-CM | POA: Diagnosis not present

## 2022-10-16 DIAGNOSIS — R1013 Epigastric pain: Secondary | ICD-10-CM | POA: Diagnosis not present

## 2022-10-16 DIAGNOSIS — R109 Unspecified abdominal pain: Secondary | ICD-10-CM | POA: Diagnosis not present

## 2022-10-16 LAB — URINALYSIS, ROUTINE W REFLEX MICROSCOPIC
Bacteria, UA: NONE SEEN
Bilirubin Urine: NEGATIVE
Glucose, UA: NEGATIVE mg/dL
Hgb urine dipstick: NEGATIVE
Ketones, ur: NEGATIVE mg/dL
Leukocytes,Ua: NEGATIVE
Nitrite: NEGATIVE
Protein, ur: 30 mg/dL — AB
Specific Gravity, Urine: 1.028 (ref 1.005–1.030)
pH: 5 (ref 5.0–8.0)

## 2022-10-16 LAB — COMPREHENSIVE METABOLIC PANEL
ALT: 21 U/L (ref 0–44)
AST: 15 U/L (ref 15–41)
Albumin: 4.1 g/dL (ref 3.5–5.0)
Alkaline Phosphatase: 106 U/L (ref 38–126)
Anion gap: 8 (ref 5–15)
BUN: 21 mg/dL — ABNORMAL HIGH (ref 6–20)
CO2: 24 mmol/L (ref 22–32)
Calcium: 8.7 mg/dL — ABNORMAL LOW (ref 8.9–10.3)
Chloride: 102 mmol/L (ref 98–111)
Creatinine, Ser: 1.19 mg/dL (ref 0.61–1.24)
GFR, Estimated: >60 mL/min (ref >60)
Glucose, Bld: 123 mg/dL — ABNORMAL HIGH (ref 70–99)
Potassium: 4.4 mmol/L (ref 3.5–5.1)
Sodium: 134 mmol/L — ABNORMAL LOW (ref 135–145)
Total Bilirubin: 1.3 mg/dL — ABNORMAL HIGH (ref 0.3–1.2)
Total Protein: 7.9 g/dL (ref 6.5–8.1)

## 2022-10-16 LAB — CBC
HCT: 48 % (ref 39.0–52.0)
Hemoglobin: 16.1 g/dL (ref 13.0–17.0)
MCH: 31.3 pg (ref 26.0–34.0)
MCHC: 33.5 g/dL (ref 30.0–36.0)
MCV: 93.4 fL (ref 80.0–100.0)
Platelets: 303 10*3/uL (ref 150–400)
RBC: 5.14 MIL/uL (ref 4.22–5.81)
RDW: 12.2 % (ref 11.5–15.5)
WBC: 14.8 10*3/uL — ABNORMAL HIGH (ref 4.0–10.5)
nRBC: 0 % (ref 0.0–0.2)

## 2022-10-16 LAB — TROPONIN I (HIGH SENSITIVITY): Troponin I (High Sensitivity): 8 ng/L (ref <18)

## 2022-10-16 LAB — LIPASE, BLOOD: Lipase: 35 U/L (ref 11–51)

## 2022-10-16 MED ORDER — IOHEXOL 350 MG/ML SOLN
100.0000 mL | Freq: Once | INTRAVENOUS | Status: AC | PRN
  Administered 2022-10-16: 100 mL via INTRAVENOUS

## 2022-10-16 MED ORDER — KETOROLAC TROMETHAMINE 15 MG/ML IJ SOLN
15.0000 mg | Freq: Once | INTRAMUSCULAR | Status: AC
  Administered 2022-10-16: 15 mg via INTRAVENOUS
  Filled 2022-10-16: qty 1

## 2022-10-16 MED ORDER — ONDANSETRON 4 MG PO TBDP: 4.0000 mg | ORAL_TABLET | Freq: Three times a day (TID) | ORAL | 0 refills | Status: AC | PRN

## 2022-10-16 MED ORDER — HYDROMORPHONE HCL 1 MG/ML IJ SOLN
0.5000 mg | Freq: Once | INTRAMUSCULAR | Status: AC
  Administered 2022-10-16: 0.5 mg via INTRAVENOUS
  Filled 2022-10-16: qty 0.5

## 2022-10-16 MED ORDER — OXYCODONE HCL 5 MG PO TABS: 5.0000 mg | ORAL_TABLET | Freq: Four times a day (QID) | ORAL | 0 refills | Status: AC | PRN

## 2022-10-16 MED ORDER — OXYCODONE HCL 5 MG PO TABS
5.0000 mg | ORAL_TABLET | Freq: Once | ORAL | Status: AC
  Administered 2022-10-16: 5 mg via ORAL
  Filled 2022-10-16: qty 1

## 2022-10-16 MED ORDER — TAMSULOSIN HCL 0.4 MG PO CAPS: 0.4000 mg | ORAL_CAPSULE | Freq: Every day | ORAL | 0 refills | Status: AC

## 2022-10-16 MED ORDER — ONDANSETRON HCL 4 MG/2ML IJ SOLN
4.0000 mg | Freq: Once | INTRAMUSCULAR | Status: AC
  Administered 2022-10-16: 4 mg via INTRAVENOUS
  Filled 2022-10-16: qty 2

## 2022-10-16 MED ORDER — IBUPROFEN 600 MG PO TABS: 600.0000 mg | ORAL_TABLET | Freq: Four times a day (QID) | ORAL | 0 refills | Status: AC | PRN

## 2022-10-16 NOTE — ED Triage Notes (Signed)
Pt to ED for mid abd pain for a few days. Denies n/v/d. Last BM Sunday.

## 2022-10-16 NOTE — ED Provider Notes (Signed)
[facility] Provider Note    Event Date/Time   First MD Initiated Contact with Patient 10/16/22 1928     (approximate)   History   Abdominal Pain   HPI  Christopher Hull is a 57 y.o. male who comes in for abdominal pain over the past few days.  Did have a bowel movement on Sunday.  No nausea vomiting or diarrhea.  Patient reports a history of bowel obstruction that resolved spontaneously.  He denies any abdominal surgeries.  He reports having some distention not passing any gas and initially thought he could just be constipated and he took some docusate and then took some bottle of mag citrate around 1:00.  He states that while in the waiting room he did pass a little bit of gas and felt a little bit better but still has some significant epigastric abdominal discomfort that feels similar to when he had a bowel obstruction previously.  He denies any significant chest pain, shortness of breath or other concerns.  He does report having intermittent headaches as well.  Physical Exam   Triage Vital Signs: ED Triage Vitals  Enc Vitals Group     BP 10/16/22 1741 135/67     Pulse Rate 10/16/22 1739 92     Resp 10/16/22 1739 18     Temp 10/16/22 1739 98.4 F (36.9 C)     Temp Source 10/16/22 1739 Oral     SpO2 10/16/22 1739 94 %     Weight 10/16/22 1743 (!) 335 lb (152 kg)     Height 10/16/22 1743 6' (1.829 m)     Head Circumference --      Peak Flow --      Pain Score 10/16/22 1742 10     Pain Loc --      Pain Edu? --      Excl. in GC? --     Most recent vital signs: Vitals:   10/16/22 1739 10/16/22 1741  BP:  135/67  Pulse: 92   Resp: 18   Temp: 98.4 F (36.9 C)   SpO2: 94%      General: Awake, no distress.  CV:  Good peripheral perfusion.  Resp:  Normal effort.  Abd:  Tender in the middle of the abdomen without any obvious rebound or guarding. Other:     ED Results / Procedures / Treatments   Labs (all labs ordered are listed, but only  abnormal results are displayed) Labs Reviewed  COMPREHENSIVE METABOLIC PANEL - Abnormal; Notable for the following components:      Result Value   Sodium 134 (*)    Glucose, Bld 123 (*)    BUN 21 (*)    Calcium 8.7 (*)    Total Bilirubin 1.3 (*)    All other components within normal limits  CBC - Abnormal; Notable for the following components:   WBC 14.8 (*)    All other components within normal limits  LIPASE, BLOOD  URINALYSIS, ROUTINE W REFLEX MICROSCOPIC       RADIOLOGY I have reviewed the CT personally interpreted and patient has a left-sided kidney stone  PROCEDURES:  Critical Care performed: No  Procedures   MEDICATIONS ORDERED IN ED: Medications - No data to display   IMPRESSION / MDM / ASSESSMENT AND PLAN / ED COURSE  I reviewed the triage vital signs and the nursing notes.   Patient's presentation is most consistent with acute presentation with potential threat to life or bodily function.  Differential is bowel obstruction, uti, kidney stone, appendicitis.  He is got no right upper quadrant tenderness to suggest gallstones.  He does report some intermittent headaches will get CT head just to make sure no mass or bleed.  Patient declines pain medication at this time.  Denies any chest pain, shortness of breath.  CBC shows elevated white count.  Lipase normal CMP reassuring slight elevation of T. bili but has had that previously no RUQ pain.   IMPRESSION: 1. Obstructing 4 mm calculus at the left ureterovesicular junction resulting in mild left hydronephrosis and perinephric stranding. Superimposed mild left nonobstructing nephrolithiasis. 2. Aortic atherosclerosis.   Aortic Atherosclerosis (ICD10-I70.0).  IMPRESSION: No acute intracranial process. No etiology is seen for the patient's headache.  UA is without evidence of UTI.  I considered antibiotics given his elevated white count but he is urine is reassuring he has no fever I discussed the case with  Dr. Richardo Hanks who agrees with holding off on antibiotics they will have close follow-up for patient I did have return precautions with both patient and his wife about returning for fever and they expressed understanding.  Patient's pain is much better controlled we discussed pain management at home and follow-up with urology outpatient.  They felt comfortable with discharge.     FINAL CLINICAL IMPRESSION(S) / ED DIAGNOSES   Final diagnoses:  Kidney stone     Rx / DC Orders   ED Discharge Orders          Ordered    ibuprofen (ADVIL) 600 MG tablet  Every 6 hours PRN        10/16/22 2213    oxyCODONE (ROXICODONE) 5 MG immediate release tablet  Every 6 hours PRN        10/16/22 2213    ondansetron (ZOFRAN-ODT) 4 MG disintegrating tablet  Every 8 hours PRN        10/16/22 2213    tamsulosin (FLOMAX) 0.4 MG CAPS capsule  Daily        10/16/22 2213             Note:  This document was prepared using Dragon voice recognition software and may include unintentional dictation errors.   Concha Se, MD 10/16/22 2214

## 2022-10-16 NOTE — Discharge Instructions (Addendum)
You have a kidney stone. See report below.   Take ibuprofen 600mg  every 6-8 hours daily (as long as you are not on any other blood thinners or have kidney disease) Take tylenol 1g every 8 hours daily. Take oxycodone for breakthrough pain. Do not drive, work, or operate machinery while on this.  Take zofran to help with nausea. Take Flomax to help dilate uretha. Use miralax to prevent constipation Call urology number above to schedule outpatient appointment. Return to ED for fevers, unable to keep food down, or any other concerns.   IMPRESSION: No acute intracranial process. No etiology is seen for the patient's headache.   IMPRESSION: 1. Obstructing 4 mm calculus at the left ureterovesicular junction resulting in mild left hydronephrosis and perinephric stranding. Superimposed mild left nonobstructing nephrolithiasis. 2. Aortic atherosclerosis.   Aortic Atherosclerosis (ICD10-I70.0).

## 2022-10-16 NOTE — ED Notes (Signed)
Sec Bonita Quin to arrange transport to floor.

## 2022-10-22 DIAGNOSIS — N202 Calculus of kidney with calculus of ureter: Secondary | ICD-10-CM | POA: Diagnosis not present

## 2022-10-26 ENCOUNTER — Telehealth: Payer: Self-pay | Admitting: Urology

## 2022-10-26 NOTE — Telephone Encounter (Signed)
LMOM for pt to call office to schedule appt:  appt in 1-2 weeks for kidney stone

## 2022-11-05 DIAGNOSIS — N132 Hydronephrosis with renal and ureteral calculous obstruction: Secondary | ICD-10-CM | POA: Diagnosis not present

## 2022-11-13 DIAGNOSIS — N201 Calculus of ureter: Secondary | ICD-10-CM | POA: Diagnosis not present

## 2023-01-04 IMAGING — DX DG ABDOMEN 1V
3 series · 3 of 3 positions shown · non-contrast
Comparison: None.

CLINICAL DATA: 54-year-old male with abdominal distension.

EXAM:
ABDOMEN - 1 VIEW

[abdomen kub (1 of 3)]
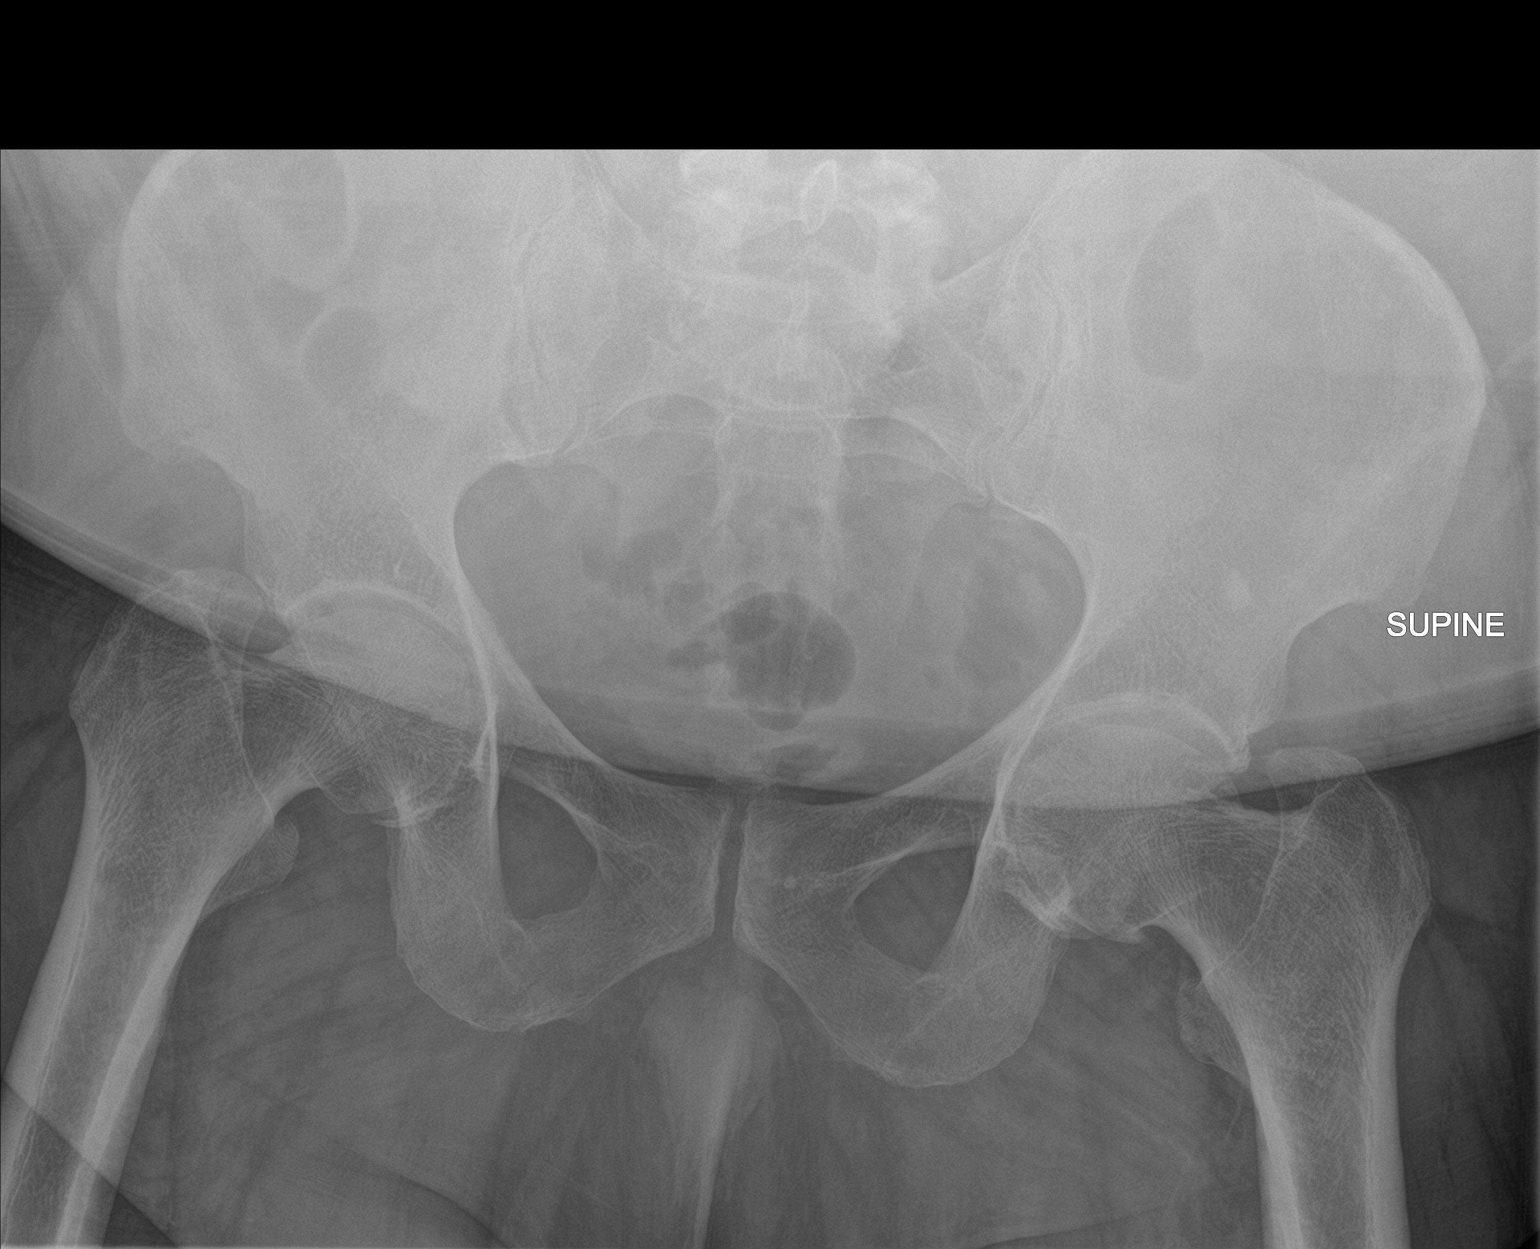

[abdomen kub (2 of 3)]
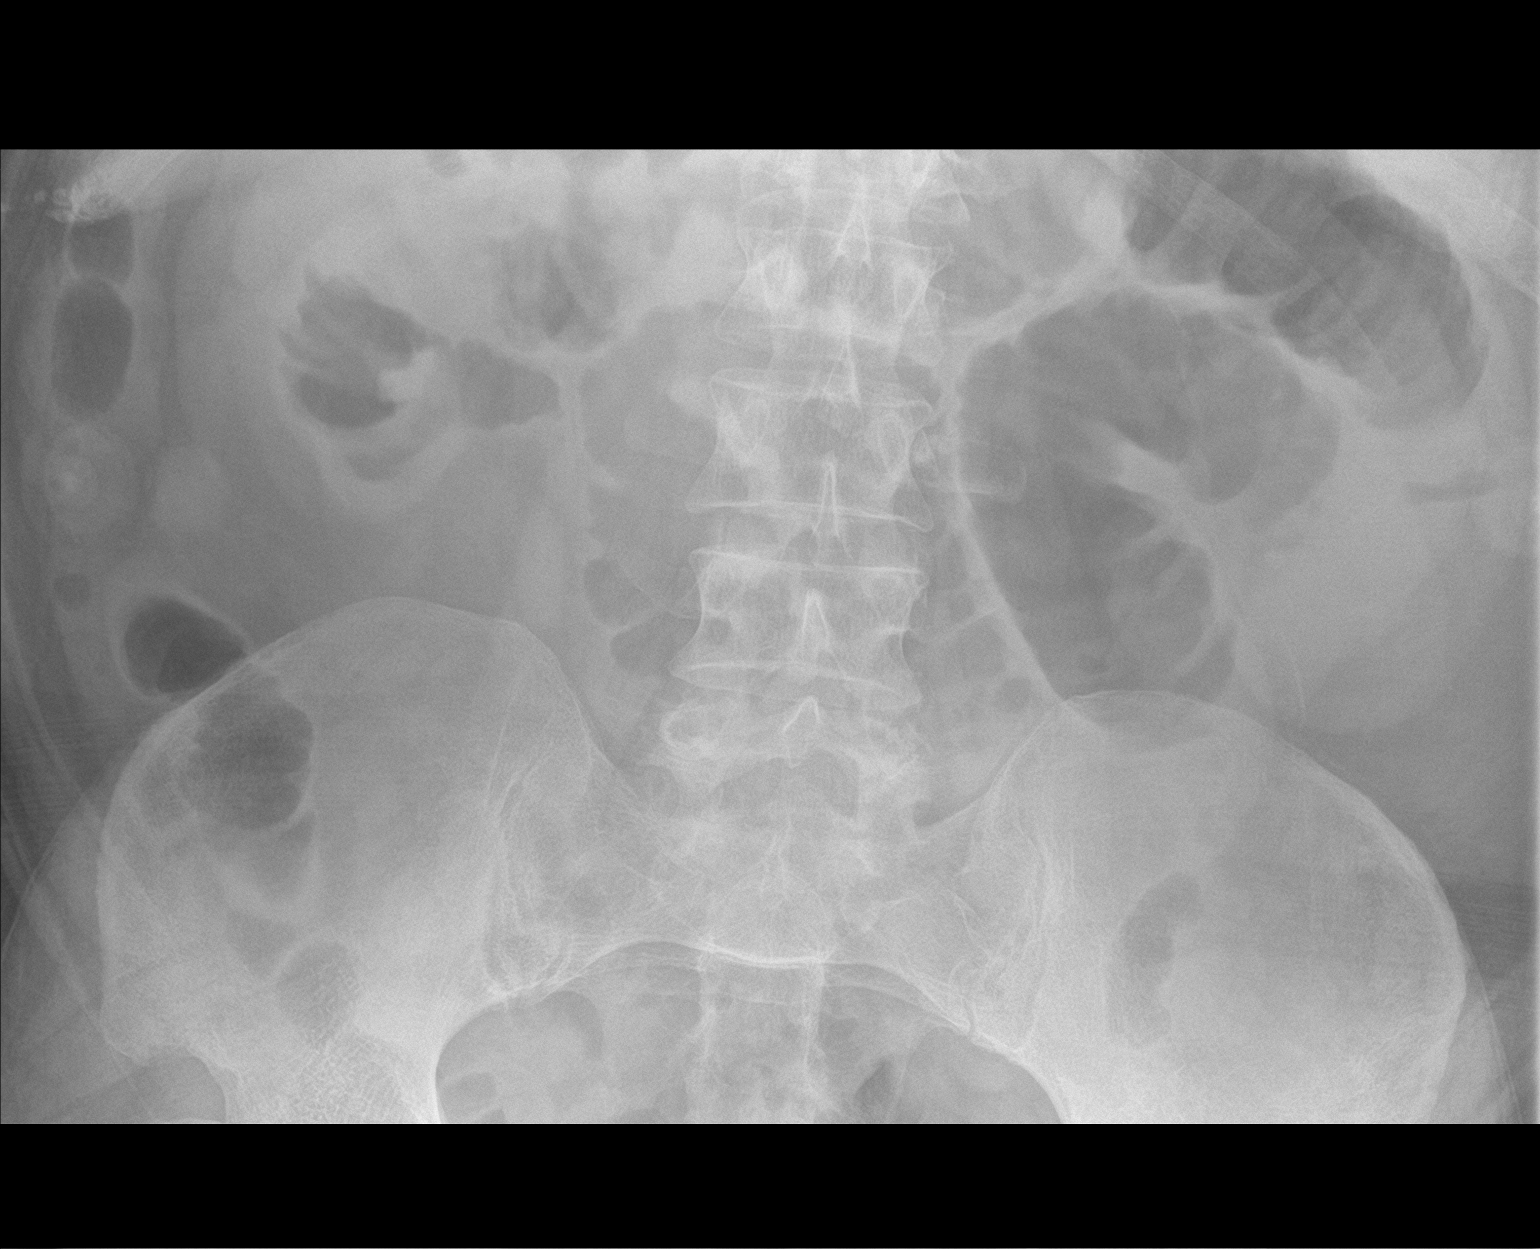

[abdomen kub (3 of 3)]
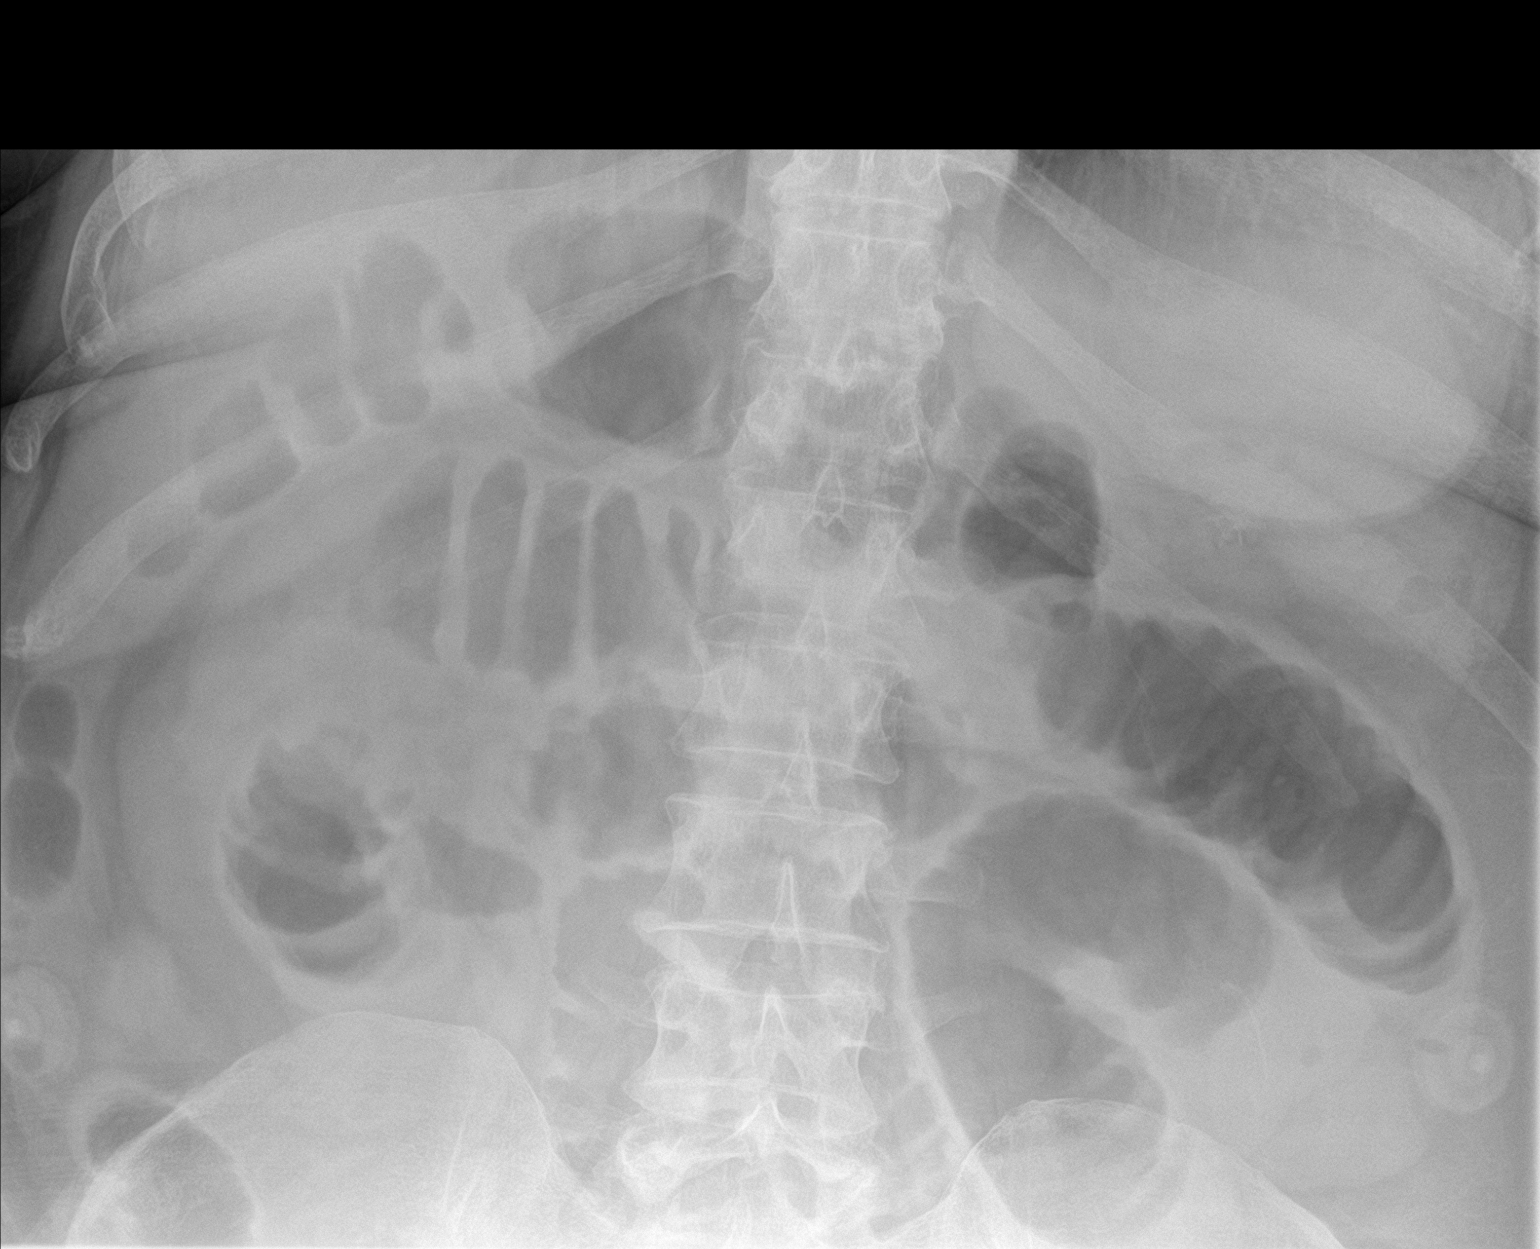

[3 of 3 positions shown; findings below may reference images not displayed]

FINDINGS: Multiple dilated and air-filled loops of small bowel measure up to
5.3 cm. Air is noted in the proximal transverse colon. No free air
or radiopaque calculi identified. The osseous structures and soft
tissues are unremarkable.
IMPRESSION: Small bowel obstruction versus ileus.

## 2023-01-04 IMAGING — CT CT ABD-PELV W/ CM
2 of 5 series · 16 of 46 positions shown, 18 images · IV contrast (APPLIED)
Comparison: Abdominal radiograph dated 11/26/2020.

CLINICAL DATA: 54-year-old male with abdominal pain.

EXAM:
CT ABDOMEN AND PELVIS WITH CONTRAST
TECHNIQUE: Multidetector CT imaging of the abdomen and pelvis was performed
using the standard protocol following bolus administration of
intravenous contrast.
CONTRAST:  100mL OMNIPAQUE IOHEXOL 300 MG/ML  SOLN

[Series 3: abdomen 5.0 · axial · 0.98mm/px · z∈[-296,+160]mm · 13 of 107 slices shown, 15 images]
[im 8/107  soft-tissue]
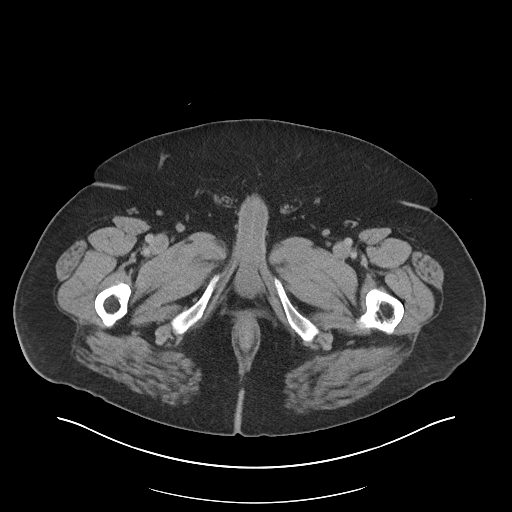
[im 8/107  bone]
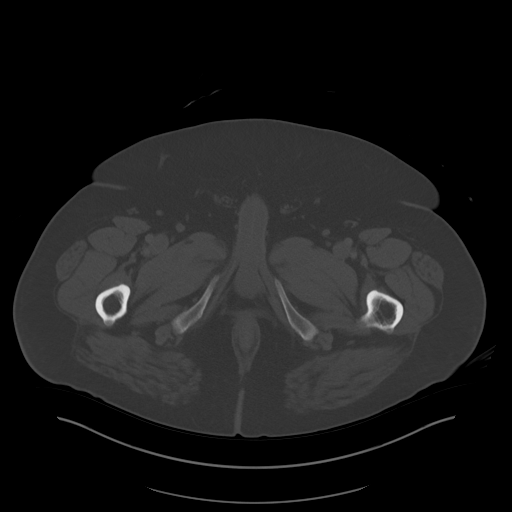
[im 15/107  soft-tissue]
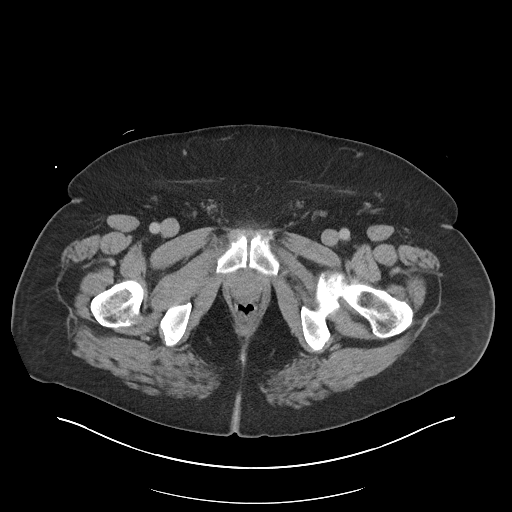
[im 22/107  soft-tissue]
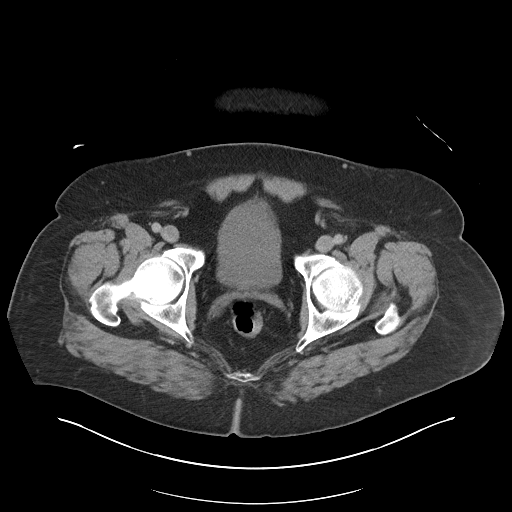
[im 29/107  soft-tissue]
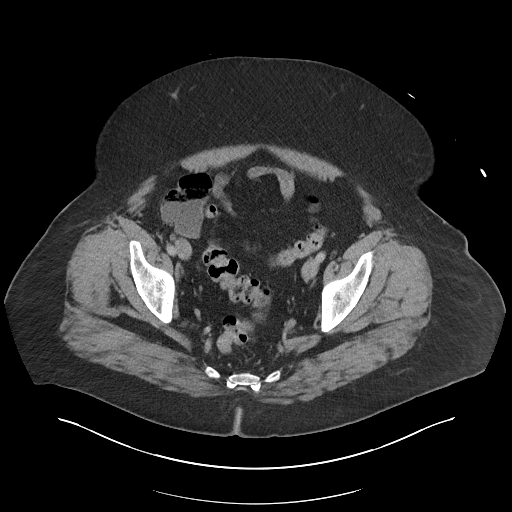
[im 36/107  soft-tissue]
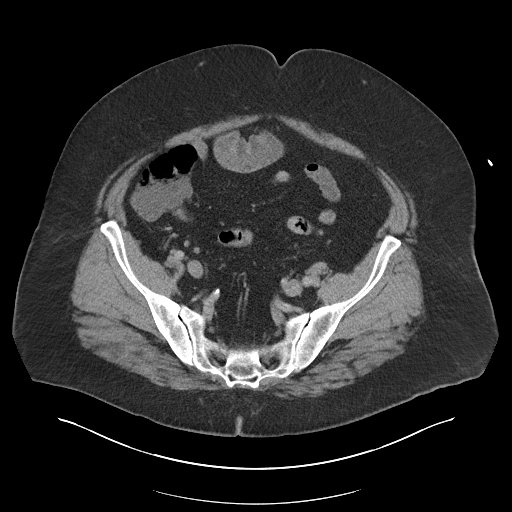
[im 43/107  soft-tissue]
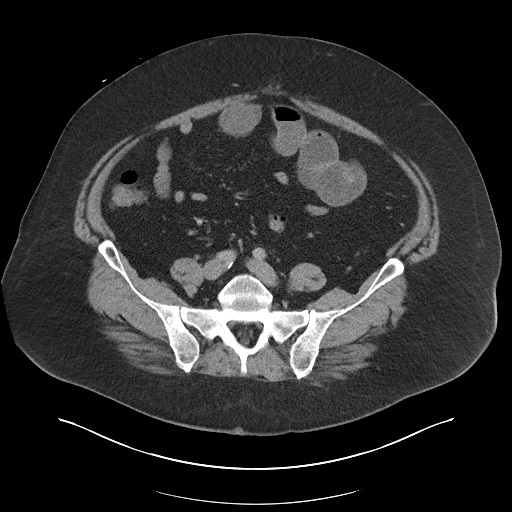
[im 57/107  soft-tissue]
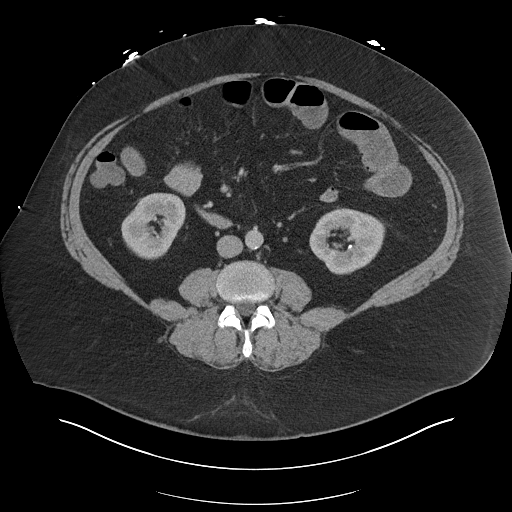
[im 64/107  soft-tissue]
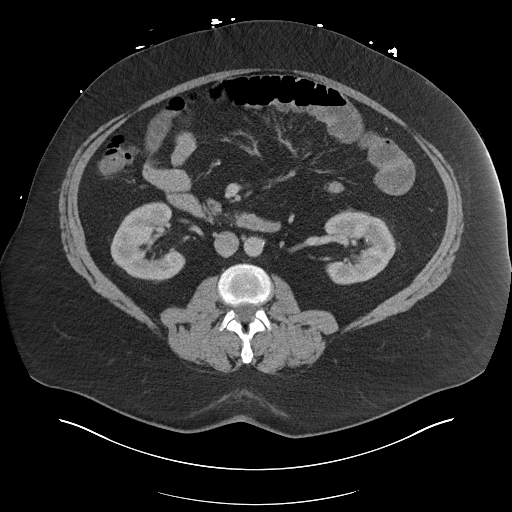
[im 71/107  soft-tissue]
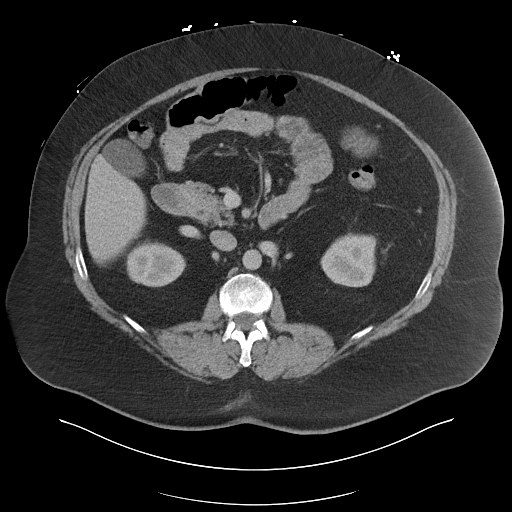
[im 71/107  bone]
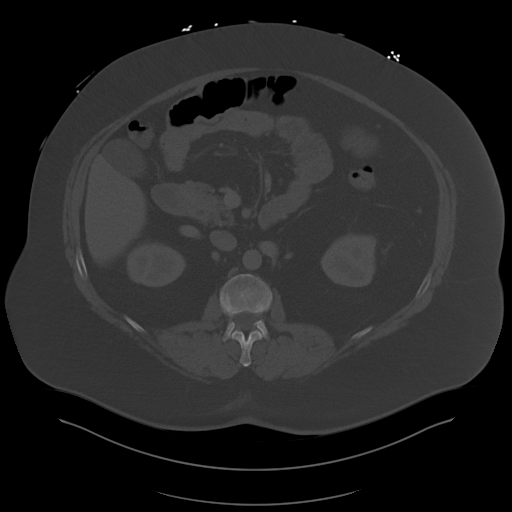
[im 78/107  soft-tissue]
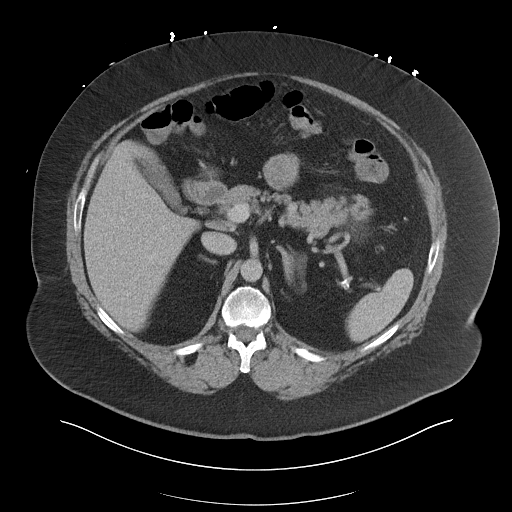
[im 85/107  soft-tissue]
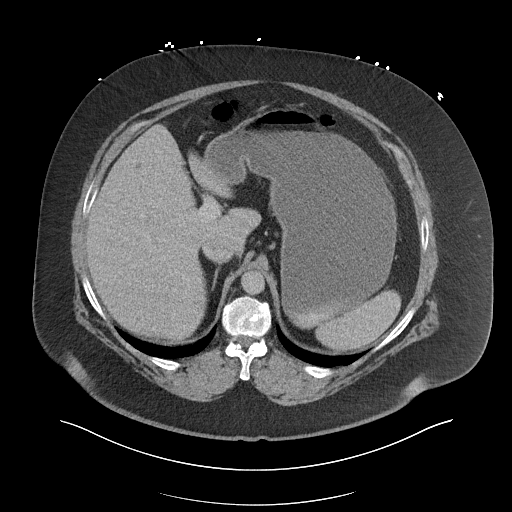
[im 92/107  soft-tissue]
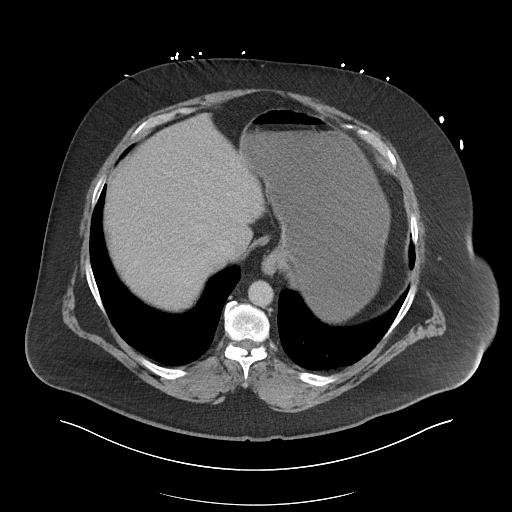
[im 99/107  soft-tissue]
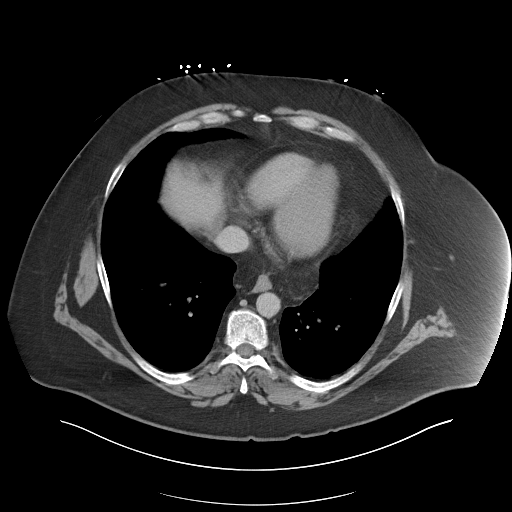

[Series 6: abdomen 3.0 mpr cor · coronal · 0.98mm/px · 3 of 139 slices shown]
[im 47/139  soft-tissue]
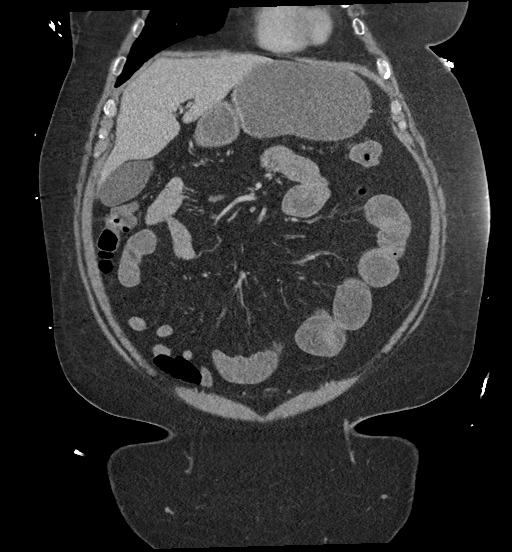
[im 62/139  soft-tissue]
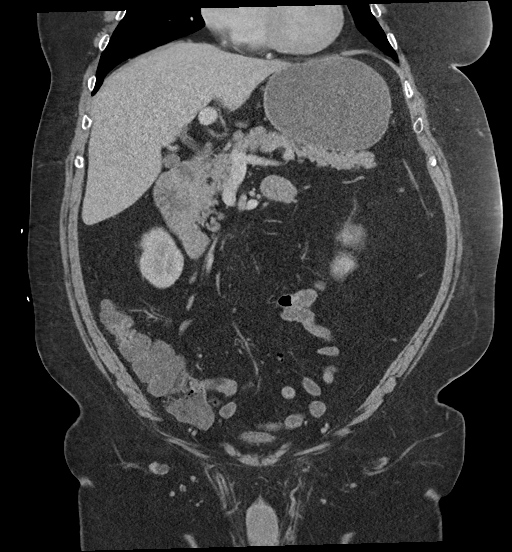
[im 77/139  soft-tissue]
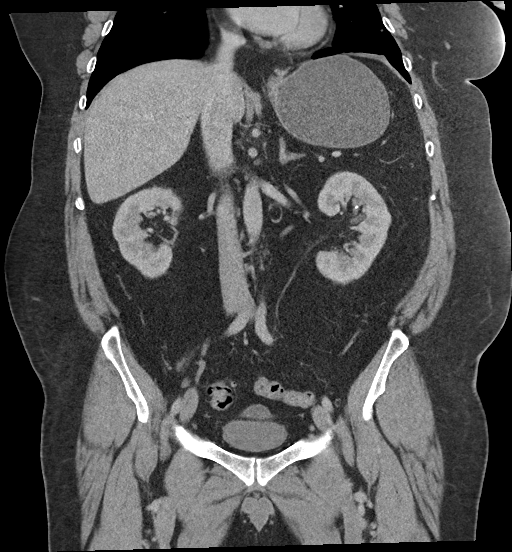

[16 of 46 positions shown; findings below may reference images not displayed]

FINDINGS: Lower chest: The visualized lung bases are clear.

No intra-abdominal free air or free fluid.

Hepatobiliary: Subcentimeter hypodense lesion in the left lobe of
the liver is too small to characterize. The liver is otherwise
unremarkable. No intrahepatic biliary dilatation. The gallbladder is
unremarkable.

Pancreas: Unremarkable. No pancreatic ductal dilatation or
surrounding inflammatory changes.

Spleen: Normal in size without focal abnormality.

Adrenals/Urinary Tract: The adrenal glands are unremarkable.
Nonobstructing left renal calculi measure up to 4 mm in the inferior
pole of the left kidney. No hydronephrosis. The right kidney is
unremarkable. The visualized ureters and urinary bladder appear
unremarkable.

Stomach/Bowel: There is sigmoid diverticulosis without active
inflammatory changes. Loose stool noted within the colon compatible
with diarrheal state. There is dilatation of proximal and mid small
bowel loops measuring up to 4.3 cm. The distal small bowel loops are
collapsed. There is gradual tapering in the right lower quadrant
likely representing an ileus. A small-bowel obstruction is not
excluded. Small-bowel series study may provide better evaluation.
The appendix is normal.

Vascular/Lymphatic: Mild aortoiliac atherosclerotic disease. The IVC
is unremarkable. No portal venous gas. There is no adenopathy.

Reproductive: The prostate and seminal vesicles are grossly
unremarkable. No pelvic mass.

Other: None

Musculoskeletal: No acute or significant osseous findings.
IMPRESSION: 1. Ileus versus possible small-bowel obstruction with transition in
the right lower quadrant. Small-bowel series study may provide
better evaluation.
2. Sigmoid diverticulosis.
3. Nonobstructing left renal calculi. No hydronephrosis.
4. Aortic Atherosclerosis ([id number]-[id number].I).

## 2023-02-05 DIAGNOSIS — H2513 Age-related nuclear cataract, bilateral: Secondary | ICD-10-CM | POA: Diagnosis not present

## 2023-02-26 DIAGNOSIS — Z1389 Encounter for screening for other disorder: Secondary | ICD-10-CM | POA: Diagnosis not present

## 2023-02-26 DIAGNOSIS — Z1212 Encounter for screening for malignant neoplasm of rectum: Secondary | ICD-10-CM | POA: Diagnosis not present

## 2023-02-26 DIAGNOSIS — I10 Essential (primary) hypertension: Secondary | ICD-10-CM | POA: Diagnosis not present

## 2023-03-05 DIAGNOSIS — R82998 Other abnormal findings in urine: Secondary | ICD-10-CM | POA: Diagnosis not present

## 2023-03-05 DIAGNOSIS — Z0000 Encounter for general adult medical examination without abnormal findings: Secondary | ICD-10-CM | POA: Diagnosis not present

## 2023-03-05 DIAGNOSIS — Z87442 Personal history of urinary calculi: Secondary | ICD-10-CM | POA: Diagnosis not present

## 2023-03-05 DIAGNOSIS — Z1339 Encounter for screening examination for other mental health and behavioral disorders: Secondary | ICD-10-CM | POA: Diagnosis not present

## 2023-03-05 DIAGNOSIS — R739 Hyperglycemia, unspecified: Secondary | ICD-10-CM | POA: Diagnosis not present

## 2023-03-05 DIAGNOSIS — K219 Gastro-esophageal reflux disease without esophagitis: Secondary | ICD-10-CM | POA: Diagnosis not present

## 2023-03-05 DIAGNOSIS — I10 Essential (primary) hypertension: Secondary | ICD-10-CM | POA: Diagnosis not present

## 2023-03-05 DIAGNOSIS — G4733 Obstructive sleep apnea (adult) (pediatric): Secondary | ICD-10-CM | POA: Diagnosis not present

## 2023-03-05 DIAGNOSIS — Z1331 Encounter for screening for depression: Secondary | ICD-10-CM | POA: Diagnosis not present

## 2023-05-04 DIAGNOSIS — I10 Essential (primary) hypertension: Secondary | ICD-10-CM | POA: Diagnosis not present

## 2023-05-04 DIAGNOSIS — Z87892 Personal history of anaphylaxis: Secondary | ICD-10-CM | POA: Diagnosis not present

## 2023-05-04 DIAGNOSIS — Z880 Allergy status to penicillin: Secondary | ICD-10-CM | POA: Diagnosis not present

## 2023-05-04 DIAGNOSIS — Z7985 Long-term (current) use of injectable non-insulin antidiabetic drugs: Secondary | ICD-10-CM | POA: Diagnosis not present

## 2023-05-04 DIAGNOSIS — Z791 Long term (current) use of non-steroidal anti-inflammatories (NSAID): Secondary | ICD-10-CM | POA: Diagnosis not present

## 2023-05-04 DIAGNOSIS — Z8249 Family history of ischemic heart disease and other diseases of the circulatory system: Secondary | ICD-10-CM | POA: Diagnosis not present

## 2023-05-04 DIAGNOSIS — G2581 Restless legs syndrome: Secondary | ICD-10-CM | POA: Diagnosis not present

## 2023-05-04 DIAGNOSIS — Z823 Family history of stroke: Secondary | ICD-10-CM | POA: Diagnosis not present

## 2023-05-04 DIAGNOSIS — Z833 Family history of diabetes mellitus: Secondary | ICD-10-CM | POA: Diagnosis not present

## 2023-05-04 DIAGNOSIS — E785 Hyperlipidemia, unspecified: Secondary | ICD-10-CM | POA: Diagnosis not present

## 2023-05-04 DIAGNOSIS — H269 Unspecified cataract: Secondary | ICD-10-CM | POA: Diagnosis not present

## 2023-05-27 ENCOUNTER — Ambulatory Visit (INDEPENDENT_AMBULATORY_CARE_PROVIDER_SITE_OTHER): Payer: 59 | Admitting: Internal Medicine

## 2023-05-27 VITALS — BP 158/91 | HR 94 | Resp 18 | Ht 73.0 in | Wt 342.0 lb

## 2023-05-27 DIAGNOSIS — I10 Essential (primary) hypertension: Secondary | ICD-10-CM

## 2023-05-27 DIAGNOSIS — G4733 Obstructive sleep apnea (adult) (pediatric): Secondary | ICD-10-CM | POA: Diagnosis not present

## 2023-05-27 DIAGNOSIS — G2581 Restless legs syndrome: Secondary | ICD-10-CM | POA: Diagnosis not present

## 2023-05-27 NOTE — Progress Notes (Signed)
 [facility] [address] [place], [place] [address]  Pulmonary Sleep Medicine   Office Visit Note  Patient Name: Christopher Hull DOB: 1966-03-14 MRN [id number]    Chief Complaint: Obstructive Sleep Apnea visit  Brief History:  Li is seen today for an annual follow up visit for BiPAP auto@ IPAP max 25, EPAP min 18, PS 4 cmH2O. The patient has a 8 year history of sleep apnea. Patient is using PAP nightly.  The patient feels rested after sleeping with PAP.  The patient reports benefiting from PAP use. Reported sleepiness is  improved and the Epworth Sleepiness Score is 1 out of 24. The patient does not take naps. The patient complains of the following: occasional bloating of the stomach. Pt is in need of supplies. The compliance download shows 100% compliance with an average use time of 8 hours 6 minutes. The AHI is 5.6.  The patient does complain of limb movements disrupting sleep but they are doing well on pramipexole . He only has symptoms when he forgets to take it. The patient continues to require PAP therapy in order to eliminate sleep apnea.   ROS  General: (-) fever, (-) chills, (-) night sweat Nose and Sinuses: (-) nasal stuffiness or itchiness, (-) postnasal drip, (-) nosebleeds, (-) sinus trouble. Mouth and Throat: (-) sore throat, (-) hoarseness. Neck: (-) swollen glands, (-) enlarged thyroid, (-) neck pain. Respiratory: - cough, - shortness of breath, - wheezing. Neurologic: - numbness, - tingling. Psychiatric: - anxiety, - depression   Current Medication: Outpatient Encounter Medications as of 05/27/2023  Medication Sig   TIRZEPATIDE [place] Inject into the skin.   amLODipine  (NORVASC ) 10 MG tablet Take 1 tablet (10 mg total) by mouth daily. OFFICE VISIT NEEDED FOR ADDITIONAL REFILLS   atorvastatin  (LIPITOR) 20 MG tablet Take 1 tablet (20 mg total) by mouth daily.   GNP GAS RELIEF 80 MG chewable tablet Chew by mouth.   losartan  (COZAAR ) 100 MG tablet Take 1  tablet (100 mg total) by mouth daily. NEEDS APPOINTMENT FOR FURTHER REFILLS   omeprazole  (PRILOSEC) 20 MG capsule TAKE (1) CAPSULE BY MOUTH ONCE DAILY.   pramipexole  (MIRAPEX ) 1 MG tablet Take 1 tablet (1 mg total) by mouth 2 (two) times daily.   No facility-administered encounter medications on file as of 05/27/2023.    Surgical History: No past surgical history on file.  Medical History: Past Medical History:  Diagnosis Date   Essential hypertension    Hyperlipidemia    Morbid obesity (HCC)    Sleep apnea     Family History: Non contributory to the present illness  Social History: Social History   Socioeconomic History   Marital status: Married    Spouse name: Not on file   Number of children: Not on file   Years of education: Not on file   Highest education level: Not on file  Occupational History   Not on file  Tobacco Use   Smoking status: Never   Smokeless tobacco: Never  Vaping Use   Vaping status: Never Used  Substance and Sexual Activity   Alcohol  use: Yes    Comment: rare   Drug use: No   Sexual activity: Not on file  Other Topics Concern   Not on file  Social History Narrative   Not on file   Social Drivers of Health   Financial Resource Strain: Not on file  Food Insecurity: Not on file  Transportation Needs: Not on file  Physical Activity: Not on file  Stress: Not on file  Social Connections: Not on file  Intimate Partner Violence: Not on file    Vital Signs: Blood pressure (!) 158/91, pulse 94, resp. rate 18, height 6' 1 (1.854 m), weight (!) 342 lb (155.1 kg), SpO2 96%. Body mass index is 45.12 kg/m.    Examination: General Appearance: The patient is well-developed, well-nourished, and in no distress. Neck Circumference: 44 cm Skin: Gross inspection of skin unremarkable. Head: normocephalic, no gross deformities. Eyes: no gross deformities noted. ENT: ears appear grossly normal Neurologic: Alert and oriented. No involuntary  movements.  STOP BANG RISK ASSESSMENT S (snore) Have you been told that you snore?     NO   T (tired) Are you often tired, fatigued, or sleepy during the day?   NO  O (obstruction) Do you stop breathing, choke, or gasp during sleep? NO   P (pressure) Do you have or are you being treated for high blood pressure? YES   B (BMI) Is your body index greater than 35 kg/m? YES   A (age) Are you 79 years old or older? YES   N (neck) Do you have a neck circumference greater than 16 inches?   YES   G (gender) Are you a male? YES   TOTAL STOP/BANG "YES" ANSWERS 5       A STOP-Bang score of 2 or less is considered low risk, and a score of 5 or more is high risk for having either moderate or severe OSA. For people who score 3 or 4, doctors may need to perform further assessment to determine how likely they are to have OSA.         EPWORTH SLEEPINESS SCALE:  Scale:  (0)= no chance of dozing; (1)= slight chance of dozing; (2)= moderate chance of dozing; (3)= high chance of dozing  Chance  Situtation    Sitting and reading: 0    Watching TV: 1    Sitting Inactive in public: 0    As a passenger in car: 0      Lying down to rest: 0    Sitting and talking: 0    Sitting quielty after lunch: 0    In a car, stopped in traffic: 0   TOTAL SCORE:   1 out of 24    SLEEP STUDIES:  PSG (09/2014) AHI 96.7/hr, min SpO2 49% Titration (10/2014) BiPAP@ EPAP min 18, IPAP max 25, PS min 4 cmH2O   CPAP COMPLIANCE DATA:  Date Range: 05/28/2022-05/27/2023  Average Daily Use: 8 hours 6 minutes  Median Use: 8 hours 5 minutes  Compliance for > 4 Hours: 100%  AHI: 5.6 respiratory events per hour  Days Used: 364/365 days  Mask Leak: 2.7  95th Percentile Pressure: 23.5/19.5         LABS: No results found for this or any previous visit (from the past 2160 hours).  Radiology: CT ABDOMEN PELVIS W CONTRAST Result Date: 10/16/2022 CLINICAL DATA:  Abdominal pain, acute,  nonlocalized EXAM: CT ABDOMEN AND PELVIS WITH CONTRAST TECHNIQUE: Multidetector CT imaging of the abdomen and pelvis was performed using the standard protocol following bolus administration of intravenous contrast. RADIATION DOSE REDUCTION: This exam was performed according to the departmental dose-optimization program which includes automated exposure control, adjustment of the mA and/or kV according to patient size and/or use of iterative reconstruction technique. CONTRAST:  OMNIPAQUE  IOHEXOL  350 MG/ML SOLN COMPARISON:  None Available. FINDINGS: Lower chest: No acute abnormality. Hepatobiliary: No focal liver abnormality is seen. No gallstones,  gallbladder wall thickening, or biliary dilatation. Pancreas: Unremarkable Spleen: Unremarkable Adrenals/Urinary Tract: The adrenal glands are unremarkable. The kidneys are normal in size and position. There is mild left hydronephrosis and perinephric stranding secondary to an obstructing 4 mm calculus at the left ureterovesicular junction. Superimposed mild left nonobstructing nephrolithiasis with multiple calculi measuring up to 4 mm within the lower pole the left kidney. No nephro or urolithiasis the bladder is otherwise unremarkable. On the right. No hydronephrosis on the right. Stomach/Bowel: Stomach is within normal limits. Appendix appears normal. No evidence of bowel wall thickening, distention, or inflammatory changes. Vascular/Lymphatic: Aortic atherosclerosis. No enlarged abdominal or pelvic lymph nodes. Reproductive: Prostate is unremarkable. Other: No abdominal wall hernia or abnormality. No abdominopelvic ascites. Musculoskeletal: No acute or significant osseous findings. IMPRESSION: 1. Obstructing 4 mm calculus at the left ureterovesicular junction resulting in mild left hydronephrosis and perinephric stranding. Superimposed mild left nonobstructing nephrolithiasis. 2. Aortic atherosclerosis. Aortic Atherosclerosis (ICD10-I70.0). Electronically Signed    By: Dorethia Molt M.D.   On: 10/16/2022 21:37   CT HEAD WO CONTRAST ( ) Result Date: 10/16/2022 CLINICAL DATA:  Headache EXAM: CT HEAD WITHOUT CONTRAST TECHNIQUE: Contiguous axial images were obtained from the base of the skull through the vertex without intravenous contrast. RADIATION DOSE REDUCTION: This exam was performed according to the departmental dose-optimization program which includes automated exposure control, adjustment of the mA and/or kV according to patient size and/or use of iterative reconstruction technique. COMPARISON:  None Available. FINDINGS: Brain: No evidence of acute infarction, hemorrhage, mass, mass effect, or midline shift. No hydrocephalus or extra-axial fluid collection. Left basal ganglia calcification. Cerebral volume within normal limits for age. Vascular: No hyperdense vessel. Skull: Negative for fracture or focal lesion. Sinuses/Orbits: No acute finding. Other: The mastoid air cells are well aerated. IMPRESSION: No acute intracranial process. No etiology is seen for the patient's headache. Electronically Signed   By: Donald Campion M.D.   On: 10/16/2022 21:24    No results found.  No results found.    Assessment and Plan: Patient Active Problem List   Diagnosis Date Noted   OSA treated with BiPAP 08/28/2021   RLS (restless legs syndrome) 08/28/2021   Ileus (HCC) 11/26/2020   Heart burn 12/10/2019   Morbid obesity (HCC) 04/07/2015   Sleep apnea 04/07/2015   Essential hypertension    Hyperlipidemia    1. OSA treated with BiPAP (Primary) The patient does tolerate PAP and reports  benefit from PAP use. His AHI has gone up, but patient has reportedly gained weight. He is working on this. He also thinks he is sleeping on his back more. He will work on avoiding supine sleep position. We will do a download in 2 months. The patient was reminded how to clean equipment and advised to replace supplies routinely. The patient was also counselled on weight loss. The  compliance is excellent. The AHI is 5.6.   OSA- suboptimal control on his current bipap settings. Encouraged weight  loss and avoiding supine sleep position. 2 month download, one year f/u.   2. RLS (restless legs syndrome) Continue pramipexole  1 mg bid. Doing well.   3. Morbid obesity (HCC) Obesity Counseling: Had a lengthy discussion regarding patients BMI and weight issues. Patient was instructed on portion control as well as increased activity. Also discussed caloric restrictions with trying to maintain intake less than 2000 Kcal. Discussions were made in accordance with the 5As of weight management. Simple actions such as not eating late and if able to, taking a  walk is suggested.   4. Essential hypertension Continue with amlodipine , losartan . Monitor blood pressure, as today's reading was up.      General Counseling: I have discussed the findings of the evaluation and examination with [place].  I have also discussed any further diagnostic evaluation thatmay be needed or ordered today. Jayro verbalizes understanding of the findings of todays visit. We also reviewed his medications today and discussed drug interactions and side effects including but not limited excessive drowsiness and altered mental states. We also discussed that there is always a risk not just to him but also people around him. he has been encouraged to call the office with any questions or concerns that should arise related to todays visit.  No orders of the defined types were placed in this encounter.       I have personally obtained a history, examined the patient, evaluated laboratory and imaging results, formulated the assessment and plan and placed orders. This patient was seen today by Lauraine Lay, PA-C in collaboration with Dr. Elfreda Bathe.   Elfreda DELENA Bathe, MD [facility] Diplomate ABMS Pulmonary Critical Care Medicine and Sleep Medicine

## 2023-05-27 NOTE — Patient Instructions (Signed)

## 2024-03-24 DIAGNOSIS — Z125 Encounter for screening for malignant neoplasm of prostate: Secondary | ICD-10-CM | POA: Diagnosis not present

## 2024-03-24 DIAGNOSIS — Z1212 Encounter for screening for malignant neoplasm of rectum: Secondary | ICD-10-CM | POA: Diagnosis not present

## 2024-03-24 DIAGNOSIS — R739 Hyperglycemia, unspecified: Secondary | ICD-10-CM | POA: Diagnosis not present

## 2024-03-24 DIAGNOSIS — I10 Essential (primary) hypertension: Secondary | ICD-10-CM | POA: Diagnosis not present

## 2024-03-24 DIAGNOSIS — E7800 Pure hypercholesterolemia, unspecified: Secondary | ICD-10-CM | POA: Diagnosis not present

## 2024-03-24 DIAGNOSIS — K219 Gastro-esophageal reflux disease without esophagitis: Secondary | ICD-10-CM | POA: Diagnosis not present

## 2024-03-31 DIAGNOSIS — Z0000 Encounter for general adult medical examination without abnormal findings: Secondary | ICD-10-CM | POA: Diagnosis not present

## 2024-03-31 DIAGNOSIS — I10 Essential (primary) hypertension: Secondary | ICD-10-CM | POA: Diagnosis not present

## 2024-03-31 DIAGNOSIS — R82998 Other abnormal findings in urine: Secondary | ICD-10-CM | POA: Diagnosis not present

## 2024-03-31 DIAGNOSIS — E7800 Pure hypercholesterolemia, unspecified: Secondary | ICD-10-CM | POA: Diagnosis not present

## 2024-03-31 DIAGNOSIS — K219 Gastro-esophageal reflux disease without esophagitis: Secondary | ICD-10-CM | POA: Diagnosis not present

## 2024-03-31 DIAGNOSIS — G4733 Obstructive sleep apnea (adult) (pediatric): Secondary | ICD-10-CM | POA: Diagnosis not present

## 2024-05-22 NOTE — Progress Notes (Deleted)
 [facility] [address] [place], [place] [address]  Pulmonary Sleep Medicine   Office Visit Note  Patient Name: Christopher Hull DOB: 07-22-1965 MRN [id number]    Chief Complaint: Obstructive Sleep Apnea visit  Brief History:  Christopher Hull is seen today for an annual follow up BIPAP Auto@ Min EPAP 18 Max IPAP 25 Min PS4. The patient has a 9 year history of sleep apnea. Patient *** using PAP nightly.  The patient feels *** after sleeping with PAP.  The patient reports *** from PAP use. Reported sleepiness is  *** and the Epworth Sleepiness Score is *** out of 24. The patient *** take naps. The patient complains of the following: ***  The compliance download shows  compliance with an average use time of *** hours. The AHI is ***  The patient *** of limb movements disrupting sleep.   ROS  General: (-) fever, (-) chills, (-) night sweat Nose and Sinuses: (-) nasal stuffiness or itchiness, (-) postnasal drip, (-) nosebleeds, (-) sinus trouble. Mouth and Throat: (-) sore throat, (-) hoarseness. Neck: (-) swollen glands, (-) enlarged thyroid, (-) neck pain. Respiratory: *** cough, *** shortness of breath, *** wheezing. Neurologic: *** numbness, *** tingling. Psychiatric: *** anxiety, *** depression   Current Medication: Outpatient Encounter Medications as of 05/25/2024  Medication Sig   amLODipine  (NORVASC ) 10 MG tablet Take 1 tablet (10 mg total) by mouth daily. OFFICE VISIT NEEDED FOR ADDITIONAL REFILLS   atorvastatin  (LIPITOR) 20 MG tablet Take 1 tablet (20 mg total) by mouth daily.   GNP GAS RELIEF 80 MG chewable tablet Chew by mouth.   losartan  (COZAAR ) 100 MG tablet Take 1 tablet (100 mg total) by mouth daily. NEEDS APPOINTMENT FOR FURTHER REFILLS   omeprazole  (PRILOSEC) 20 MG capsule TAKE (1) CAPSULE BY MOUTH ONCE DAILY.   pramipexole  (MIRAPEX ) 1 MG tablet Take 1 tablet (1 mg total) by mouth 2 (two) times daily.   TIRZEPATIDE [place] Inject into the skin.   No  facility-administered encounter medications on file as of 05/25/2024.    Surgical History: No past surgical history on file.  Medical History: Past Medical History:  Diagnosis Date   Essential hypertension    Hyperlipidemia    Morbid obesity (HCC)    Sleep apnea     Family History: Non contributory to the present illness  Social History: Social History   Socioeconomic History   Marital status: Married    Spouse name: Not on file   Number of children: Not on file   Years of education: Not on file   Highest education level: Not on file  Occupational History   Not on file  Tobacco Use   Smoking status: Never   Smokeless tobacco: Never  Vaping Use   Vaping status: Never Used  Substance and Sexual Activity   Alcohol  use: Yes    Comment: rare   Drug use: No   Sexual activity: Not on file  Other Topics Concern   Not on file  Social History Narrative   Not on file   Social Drivers of Health   Tobacco Use: Low Risk (08/28/2021)   Patient History    Smoking Tobacco Use: Never    Smokeless Tobacco Use: Never    Passive Exposure: Not on file  Financial Resource Strain: Not on file  Food Insecurity: Not on file  Transportation Needs: Not on file  Physical Activity: Not on file  Stress: Not on file  Social Connections: Not on file  Intimate Partner  Violence: Not on file  Depression (EYV7-0): Not on file  Alcohol  Screen: Not on file  Housing: Not on file  Utilities: Not on file  Health Literacy: Not on file    Vital Signs: There were no vitals taken for this visit. There is no height or weight on file to calculate BMI.    Examination: General Appearance: The patient is well-developed, well-nourished, and in no distress. Neck Circumference: *** Skin: Gross inspection of skin unremarkable. Head: normocephalic, no gross deformities. Eyes: no gross deformities noted. ENT: ears appear grossly normal Neurologic: Alert and oriented. No involuntary  movements.  STOP BANG RISK ASSESSMENT S (snore) Have you been told that you snore?     YES/NO   T (tired) Are you often tired, fatigued, or sleepy during the day?   YES/NO  O (obstruction) Do you stop breathing, choke, or gasp during sleep? YES/NO   P (pressure) Do you have or are you being treated for high blood pressure? YES/NO   B (BMI) Is your body index greater than 35 kg/m? YES/NO   A (age) Are you 27 years old or older? YES   N (neck) Do you have a neck circumference greater than 16 inches?   YES/NO   G (gender) Are you a male? YES   TOTAL STOP/BANG YES ANSWERS        A STOP-Bang score of 2 or less is considered low risk, and a score of 5 or more is high risk for having either moderate or severe OSA. For people who score 3 or 4, doctors may need to perform further assessment to determine how likely they are to have OSA.         EPWORTH SLEEPINESS SCALE:  Scale:  (0)= no chance of dozing; (1)= slight chance of dozing; (2)= moderate chance of dozing; (3)= high chance of dozing  Chance  Situtation    Sitting and reading: ***    Watching TV: ***    Sitting Inactive in public: ***    As a passenger in car: ***      Lying down to rest: ***    Sitting and talking: ***    Sitting quielty after lunch: ***    In a car, stopped in traffic: ***   TOTAL SCORE:   *** out of 24    SLEEP STUDIES:  PSG (09/2014) AHI 96.7/hr, min SpO2 49% Titration (10/2014) BiPAP@ EPAP min 18, IPAP max 25, PS min 4 cmH2O   CPAP COMPLIANCE DATA:  Date Range: ***  Average Daily Use: *** hours  Median Use: ***  Compliance for > 4 Hours: *** days  AHI: *** respiratory events per hour  Days Used: ***  Mask Leak: ***  95th Percentile Pressure: ***         LABS: No results found for this or any previous visit (from the past 2160 hours).  Radiology: CT ABDOMEN PELVIS W CONTRAST Result Date: 10/16/2022 CLINICAL DATA:  Abdominal pain, acute, nonlocalized EXAM: CT  ABDOMEN AND PELVIS WITH CONTRAST TECHNIQUE: Multidetector CT imaging of the abdomen and pelvis was performed using the standard protocol following bolus administration of intravenous contrast. RADIATION DOSE REDUCTION: This exam was performed according to the departmental dose-optimization program which includes automated exposure control, adjustment of the mA and/or kV according to patient size and/or use of iterative reconstruction technique. CONTRAST:  OMNIPAQUE  IOHEXOL  350 MG/ML SOLN COMPARISON:  None Available. FINDINGS: Lower chest: No acute abnormality. Hepatobiliary: No focal liver abnormality is seen. No gallstones,  gallbladder wall thickening, or biliary dilatation. Pancreas: Unremarkable Spleen: Unremarkable Adrenals/Urinary Tract: The adrenal glands are unremarkable. The kidneys are normal in size and position. There is mild left hydronephrosis and perinephric stranding secondary to an obstructing 4 mm calculus at the left ureterovesicular junction. Superimposed mild left nonobstructing nephrolithiasis with multiple calculi measuring up to 4 mm within the lower pole the left kidney. No nephro or urolithiasis the bladder is otherwise unremarkable. On the right. No hydronephrosis on the right. Stomach/Bowel: Stomach is within normal limits. Appendix appears normal. No evidence of bowel wall thickening, distention, or inflammatory changes. Vascular/Lymphatic: Aortic atherosclerosis. No enlarged abdominal or pelvic lymph nodes. Reproductive: Prostate is unremarkable. Other: No abdominal wall hernia or abnormality. No abdominopelvic ascites. Musculoskeletal: No acute or significant osseous findings. IMPRESSION: 1. Obstructing 4 mm calculus at the left ureterovesicular junction resulting in mild left hydronephrosis and perinephric stranding. Superimposed mild left nonobstructing nephrolithiasis. 2. Aortic atherosclerosis. Aortic Atherosclerosis (ICD10-I70.0). Electronically Signed   By: Dorethia Molt  M.D.   On: 10/16/2022 21:37   CT HEAD WO CONTRAST ( ) Result Date: 10/16/2022 CLINICAL DATA:  Headache EXAM: CT HEAD WITHOUT CONTRAST TECHNIQUE: Contiguous axial images were obtained from the base of the skull through the vertex without intravenous contrast. RADIATION DOSE REDUCTION: This exam was performed according to the departmental dose-optimization program which includes automated exposure control, adjustment of the mA and/or kV according to patient size and/or use of iterative reconstruction technique. COMPARISON:  None Available. FINDINGS: Brain: No evidence of acute infarction, hemorrhage, mass, mass effect, or midline shift. No hydrocephalus or extra-axial fluid collection. Left basal ganglia calcification. Cerebral volume within normal limits for age. Vascular: No hyperdense vessel. Skull: Negative for fracture or focal lesion. Sinuses/Orbits: No acute finding. Other: The mastoid air cells are well aerated. IMPRESSION: No acute intracranial process. No etiology is seen for the patient's headache. Electronically Signed   By: Donald Campion M.D.   On: 10/16/2022 21:24    No results found.  No results found.    Assessment and Plan: Patient Active Problem List   Diagnosis Date Noted   OSA treated with BiPAP 08/28/2021   RLS (restless legs syndrome) 08/28/2021   Ileus (HCC) 11/26/2020   Heart burn 12/10/2019   Morbid obesity (HCC) 04/07/2015   Sleep apnea 04/07/2015   Essential hypertension    Hyperlipidemia       The patient *** tolerate PAP and reports *** benefit from PAP use. The patient was reminded how to *** and advised to ***. The patient was also counselled on ***. The compliance is ***. The AHI is ***.   ***  General Counseling: I have discussed the findings of the evaluation and examination with [place].  I have also discussed any further diagnostic evaluation thatmay be needed or ordered today. Rece verbalizes understanding of the findings of todays visit. We also  reviewed his medications today and discussed drug interactions and side effects including but not limited excessive drowsiness and altered mental states. We also discussed that there is always a risk not just to him but also people around him. he has been encouraged to call the office with any questions or concerns that should arise related to todays visit.  No orders of the defined types were placed in this encounter.       I have personally obtained a history, examined the patient, evaluated laboratory and imaging results, formulated the assessment and plan and placed orders.  Elfreda DELENA Bathe, MD [facility] Diplomate ABMS Pulmonary Critical Care  Medicine and Sleep Medicine

## 2024-05-25 ENCOUNTER — Ambulatory Visit: Payer: Self-pay

## 2024-06-08 NOTE — Progress Notes (Signed)
 Christopher Hull                                          MRN: [id number]   06/08/2024   The [facility] Quality Team Specialist reviewed this patient medical record for the purposes of chart review for care gap closure. The following were reviewed: chart review for care gap closure-controlling blood pressure.    [facility] Quality Team
